# Patient Record
Sex: Female | Born: 1945 | Race: White | Hispanic: No | Marital: Married | Smoking: Never smoker
Health system: Southern US, Academic
[De-identification: ages and names within clinical notes are randomized; demographics above are authoritative.]

## PROBLEM LIST (undated history)

## (undated) DIAGNOSIS — E039 Hypothyroidism, unspecified: Secondary | ICD-10-CM

## (undated) DIAGNOSIS — G4730 Sleep apnea, unspecified: Secondary | ICD-10-CM

## (undated) DIAGNOSIS — I10 Essential (primary) hypertension: Secondary | ICD-10-CM

## (undated) HISTORY — PX: HX KNEE REPLACMENT: SHX125

## (undated) HISTORY — PX: HX BACK SURGERY: SHX140

## (undated) HISTORY — PX: LUMBAR DISC SURGERY: SHX700

## (undated) HISTORY — PX: HX HERNIA REPAIR: SHX51

## (undated) HISTORY — PX: HX APPENDECTOMY: SHX54

## (undated) NOTE — Telephone Encounter (Signed)
Formatting of this note might be different from the original.  Pt is calling back to confirm that you got her messages.  She said the local facilities you can look at are:    [facility]    [facility]    [facility]    [facility]    Please call her back to confirm where   Electronically signed by Odella Aquas at 01/23/2023 10:46 AM EDT

## (undated) NOTE — Telephone Encounter (Signed)
Formatting of this note might be different from the original.  Called patient to go over the patients Film Review per Dr. Debarah Crape.  Patient hasn't gotten an EMG as of yet because she needed this to be scheduled closer to home. She advised she gave information to [facility] to have it scheduled near her and Tresa Endo was going to take care of it for her. Advised the patient that she needs to see pain management per Dr. Debarah Crape and she advised that she has an appointment in September to see them. Patient request that the results of Dr. De Hollingshead film review sent to [facility]. Received the patients email address to set up mychart. Advised the patient to look out for the email. Once my chart is set up I will be able to send Dr. De Hollingshead review to her. Patient agreed to Dr. De Hollingshead recommendations.  Electronically signed by Lyman Bishop, RN at 03/24/2023 10:03 AM EDT

---

## 1993-01-24 ENCOUNTER — Other Ambulatory Visit (HOSPITAL_COMMUNITY): Payer: Self-pay

## 2010-10-11 IMAGING — MG MAMMO SCREEN W CAD
1 series · 5 of 5 positions shown · non-contrast
Comparison: 03/19/07 and 01/16/06.

Guraziu, Orgesi Z
INDICATION: Screening
ADDITIONAL HISTORY:
Patient denies previous breast surgery. She denies any current problems with her breasts. 

RISK FACTOR:
Positive family history of breast cancer (mother).
TECHNIQUE: Bilateral MLO and CC digital images were obtained. This study was reviewed using iCAD 200 software.

[Series 2: R CC · right · 5 of 5 slices shown]
[im 1/5]
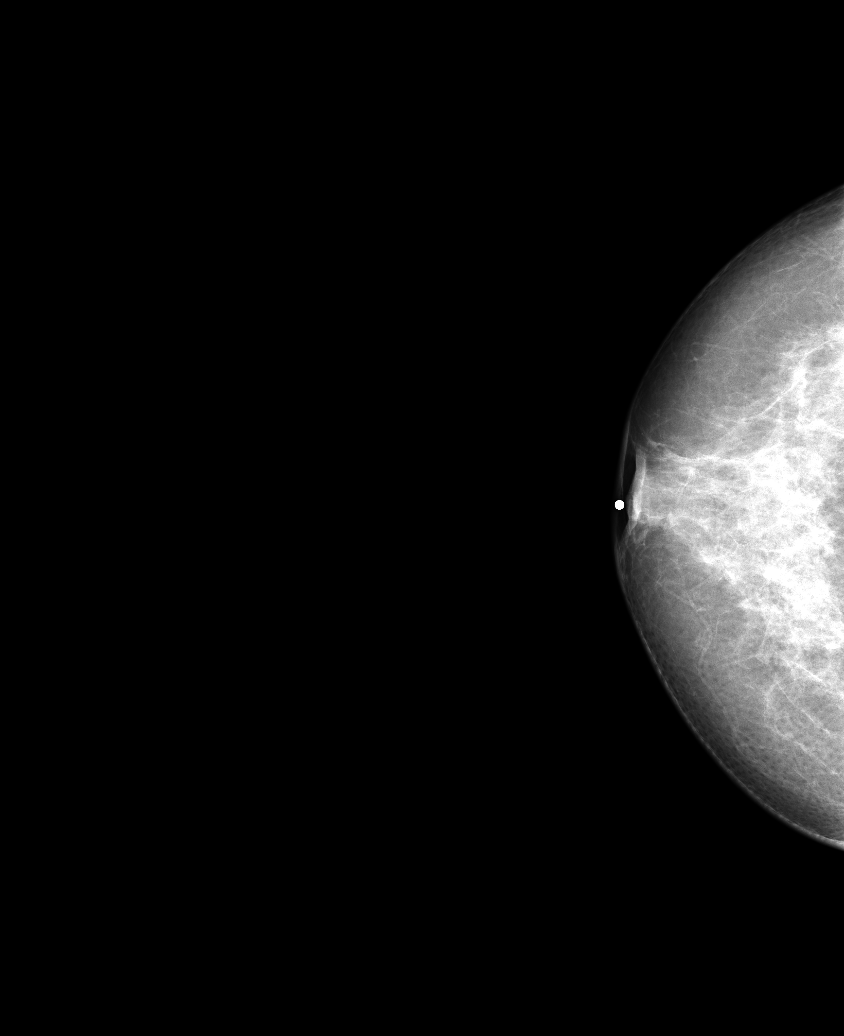
[im 2/5]
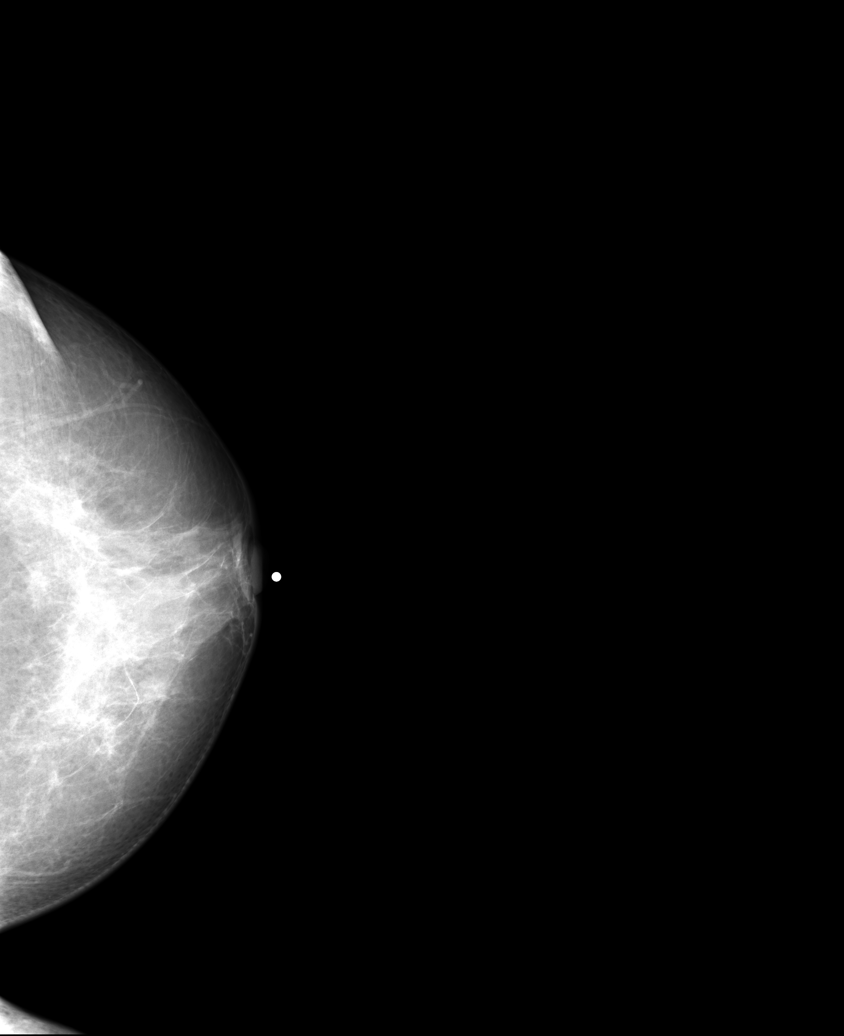
[im 3/5]
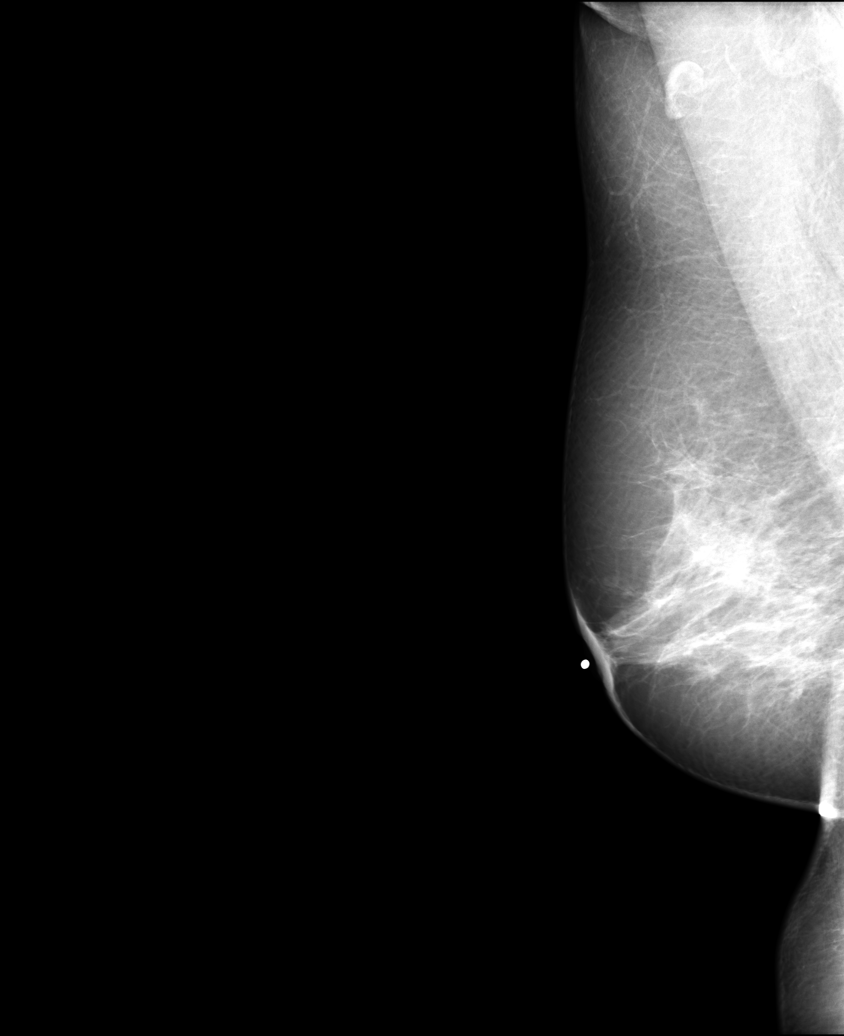
[im 4/5]
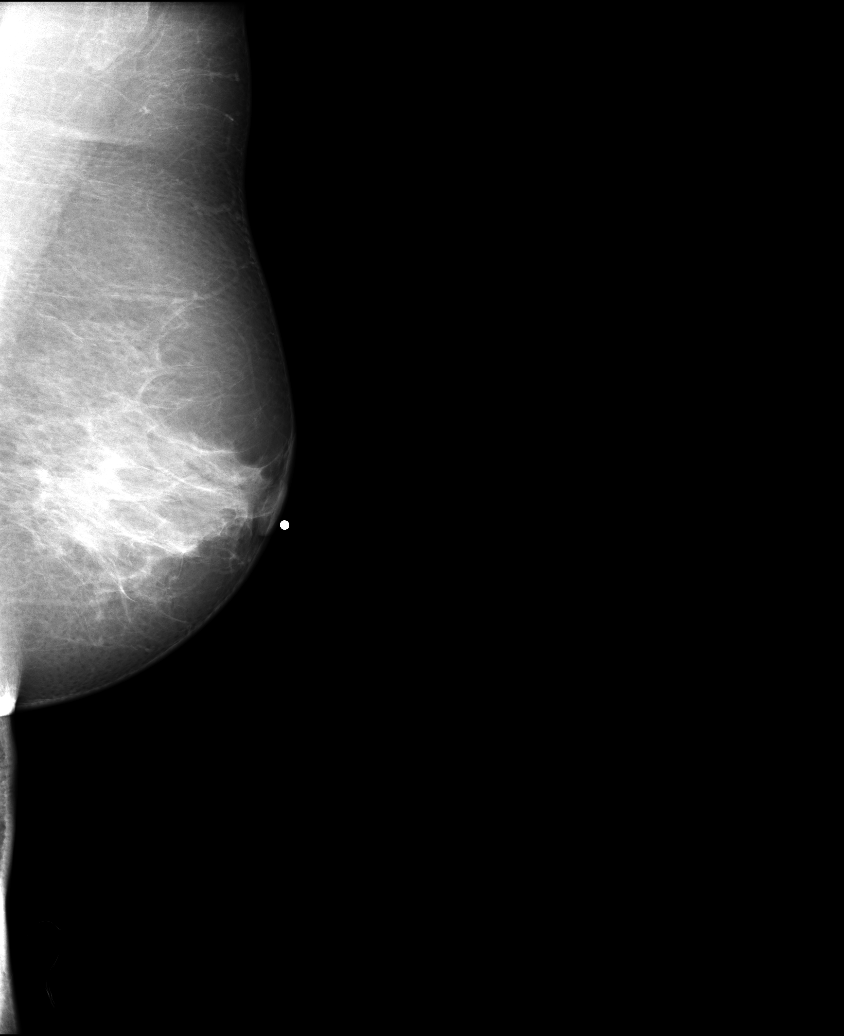
[im 5/5]
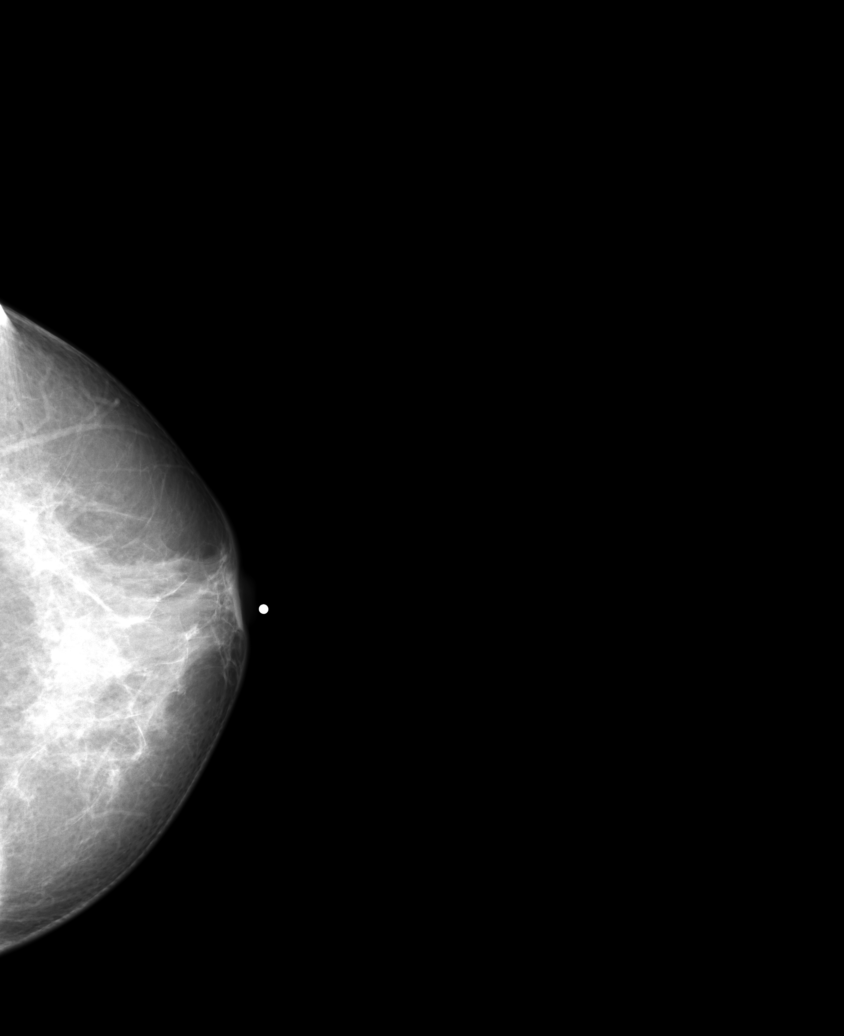

[5 of 5 positions shown; findings below may reference images not displayed]

FINDINGS: The breasts have scattered fibroglandular density. There is no skin thickening, or nipple retraction noted. No suspicious masses, microcalcifications or architectural distortion are identified.
IMPRESSION: BI-RADS 1

RECOMMENDATION: Annual screening per ACR guidelines and monthly self breast examination. 

FINAL ASSESSMENT CODE:
BI-RADS 1

________________________________

## 2013-07-03 IMAGING — US US ABDOMEN LIMITED
1 series · 14 of 25 positions shown · non-contrast
Comparison: Abdominal sonogram dated 02/07/2013.

Exam:   
Ultrasound abdomen limited
INDICATION: Abdominal pain.

[Series 1: us abdomen limited · oblique · 0.31mm/px · 14 of 47 slices shown]
[im 1/47]
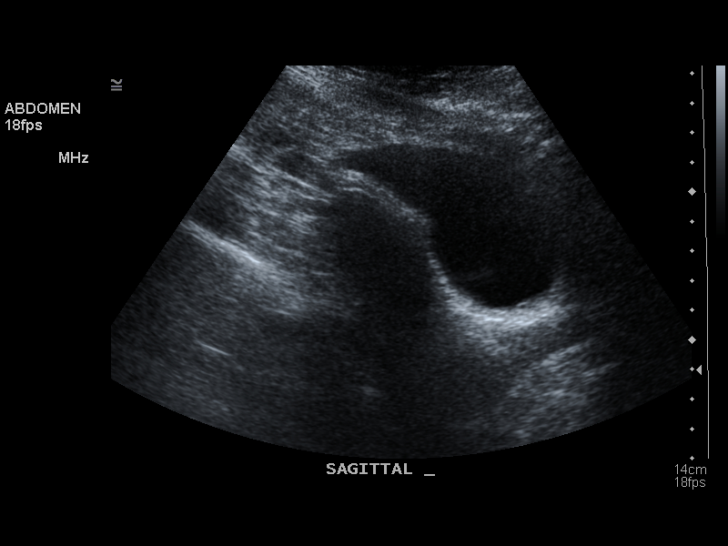
[im 4/47]
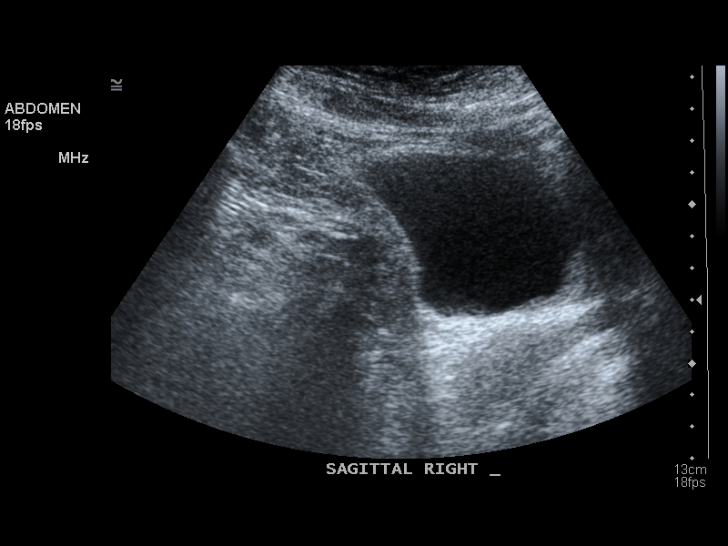
[im 8/47]
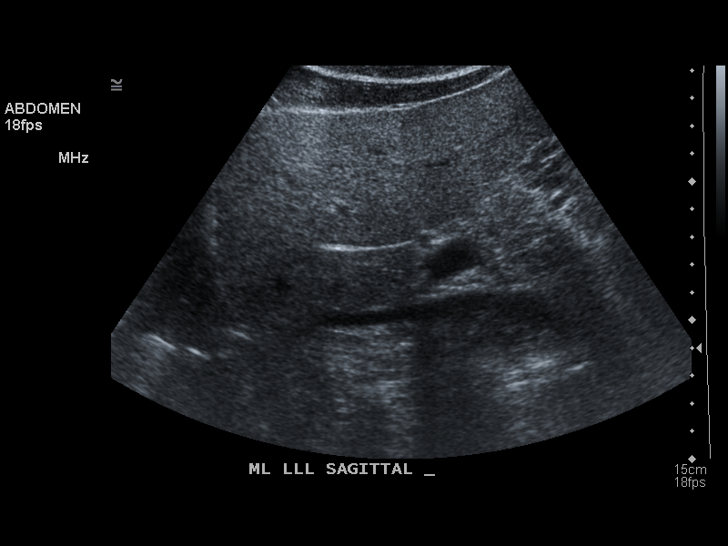
[im 12/47]
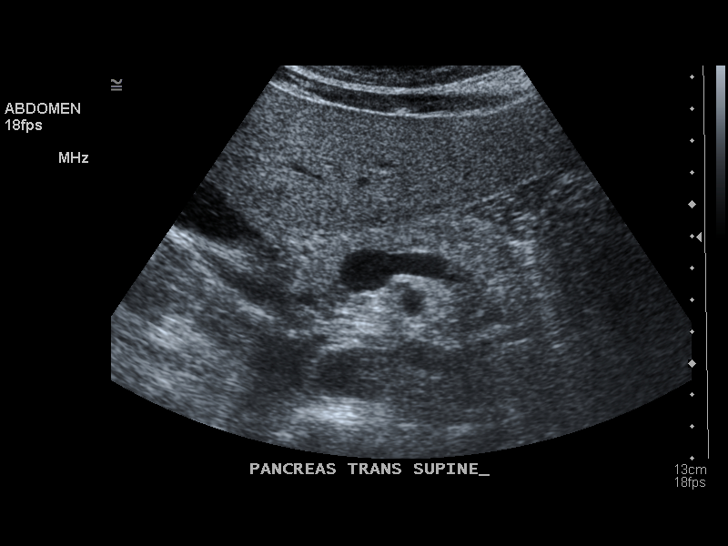
[im 16/47]
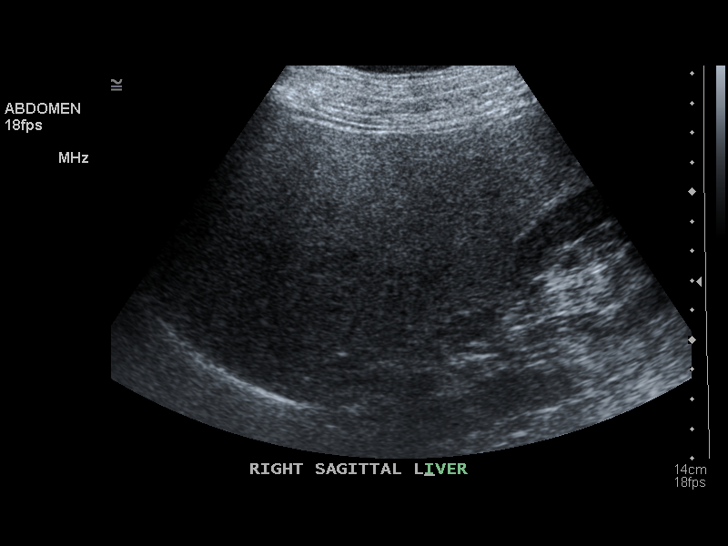
[im 18/47]
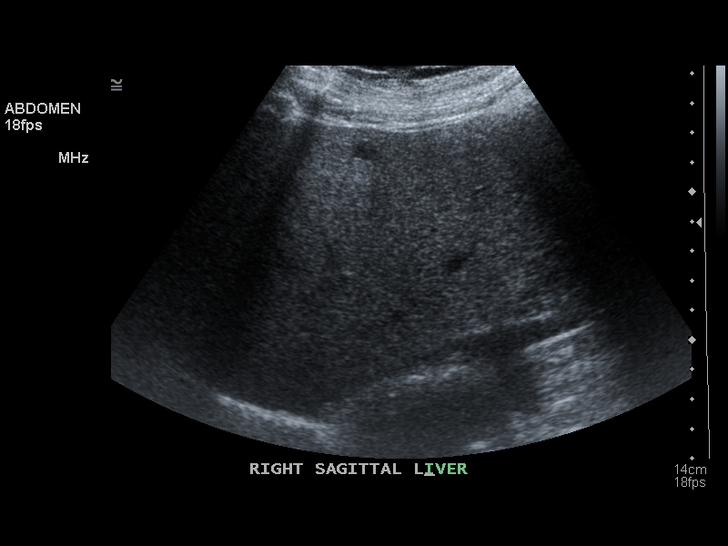
[im 22/47]
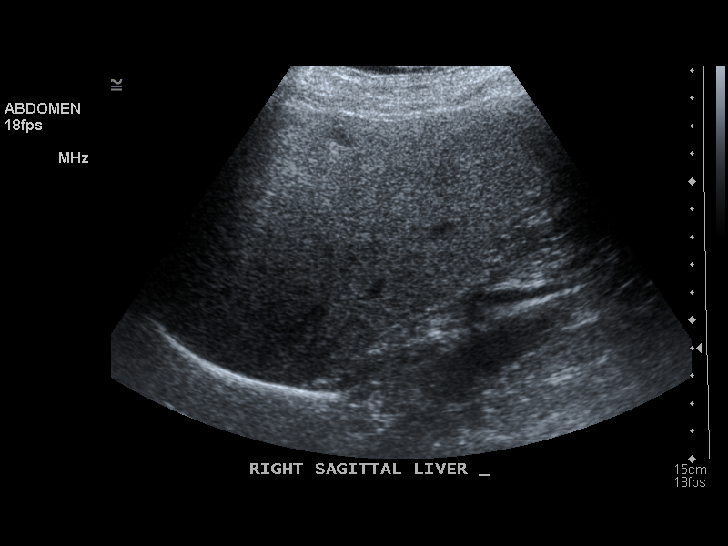
[im 25/47]
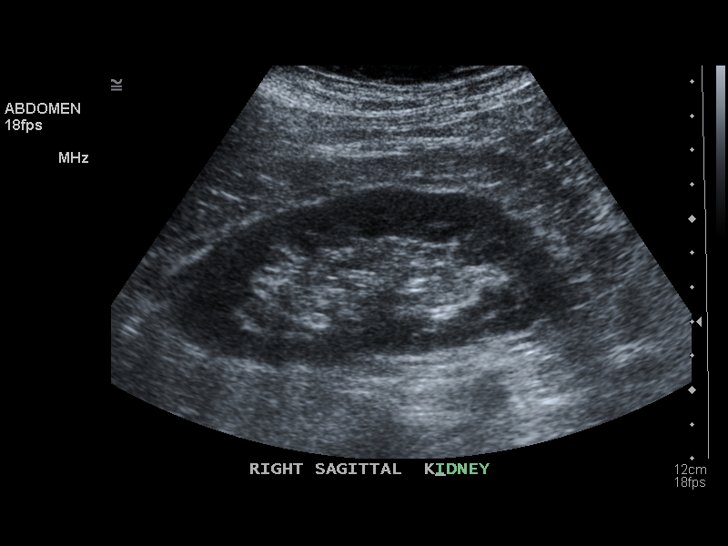
[im 29/47]
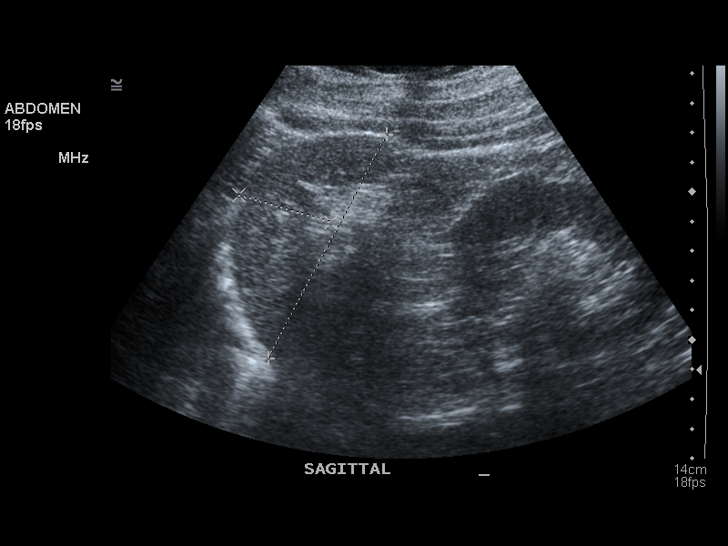
[im 31/47]
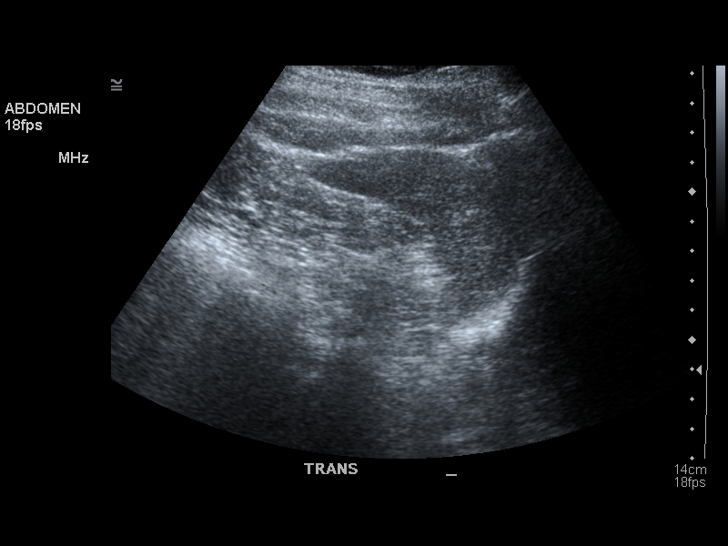
[im 35/47]
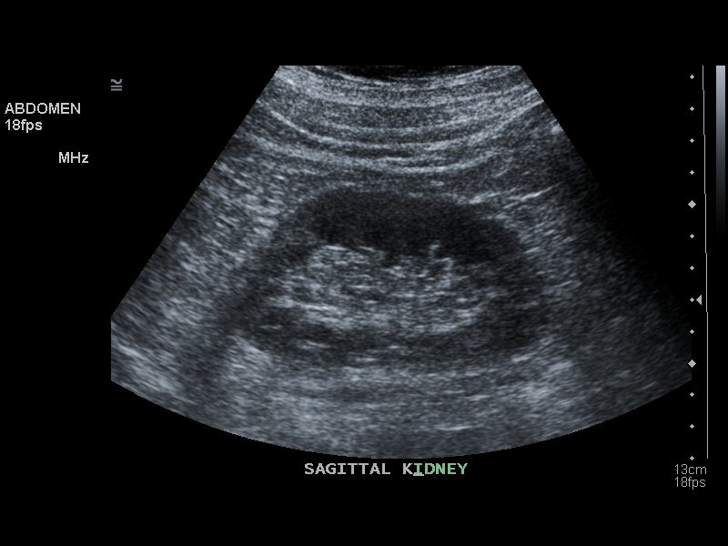
[im 39/47]
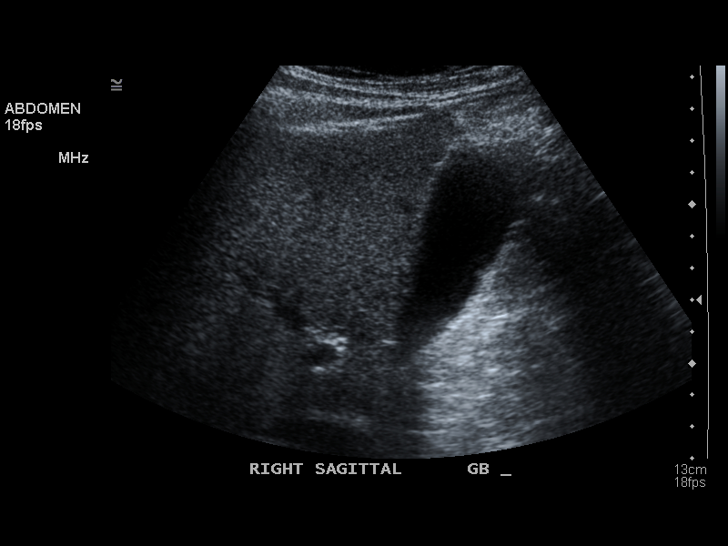
[im 43/47]
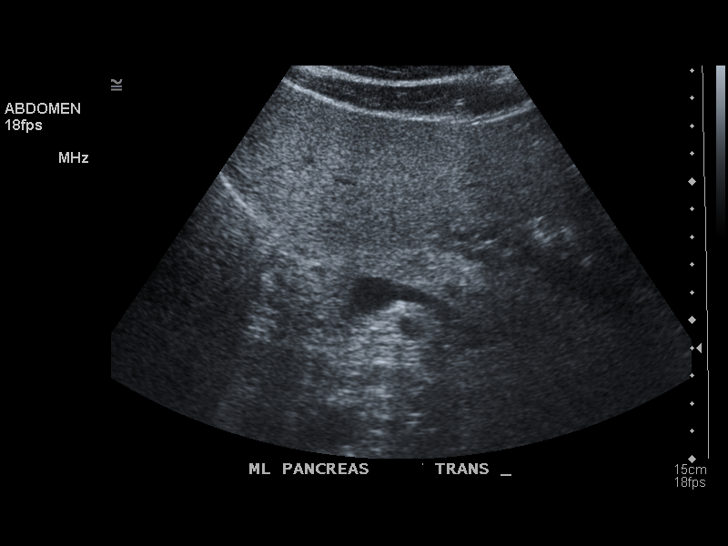
[im 47/47]
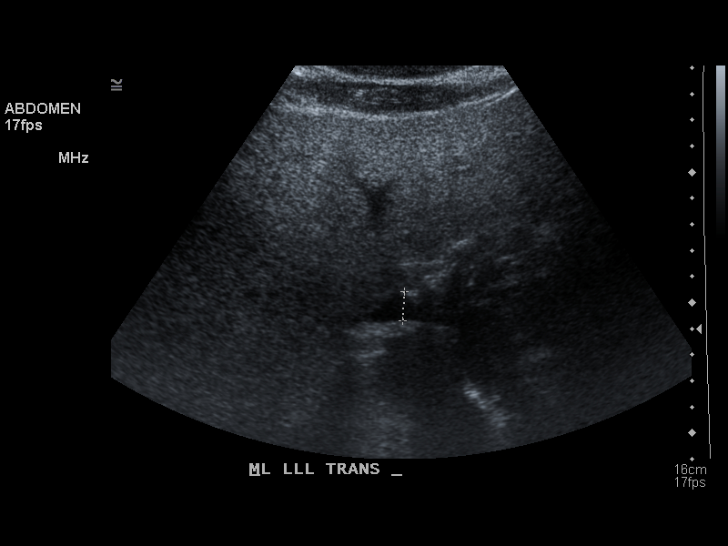

[14 of 25 positions shown; findings below may reference images not displayed]

FINDINGS: Liver is echogenic compatible with fatty infiltration. This limits evaluation for a focal hepatic mass. 8 mm right hepatic lobe cyst is stable. There is no intra or extra hepatic biliary ductal dilatation. Common bile duct measures 5 mm. No sludge or shadowing calculi are seen within the gallbladder. There is no gallbladder wall thickening or pericholecystic fluid. Pancreas is normal. Spleen measures 8.5 cm and is unremarkable. 
Kidneys are normal in echogenicity. Right kidney measures 11 cm and left kidney measures 10 cm. There is no hydronephrosis, mass or shadowing calculus on either side. 
Visualized abdominal aorta is without aneurysmal dilatation. IVC is normal. There is no ascites.
IMPRESSION: 1.
 Fatty liver. 
2.
Stable 8 mm right hepatic lobe cyst. 
3.
No evidence of cholelithiasis or acute cholecystitis.

## 2013-08-01 IMAGING — MG MAMMO DIGITAL SCREENING
1 series · 4 of 4 positions shown · non-contrast
Comparison: 

------------- REPORT [id number] -------------
HOLLY, HON KEUNG

MAMMO DIGITAL SCREENING WITH CAD
Exam:  
Bilateral digital screening mammogram with CAD
INDICATION: Annual screening.

[R CC · oblique · right · 4 of 4 slices shown]
[im 1/4]
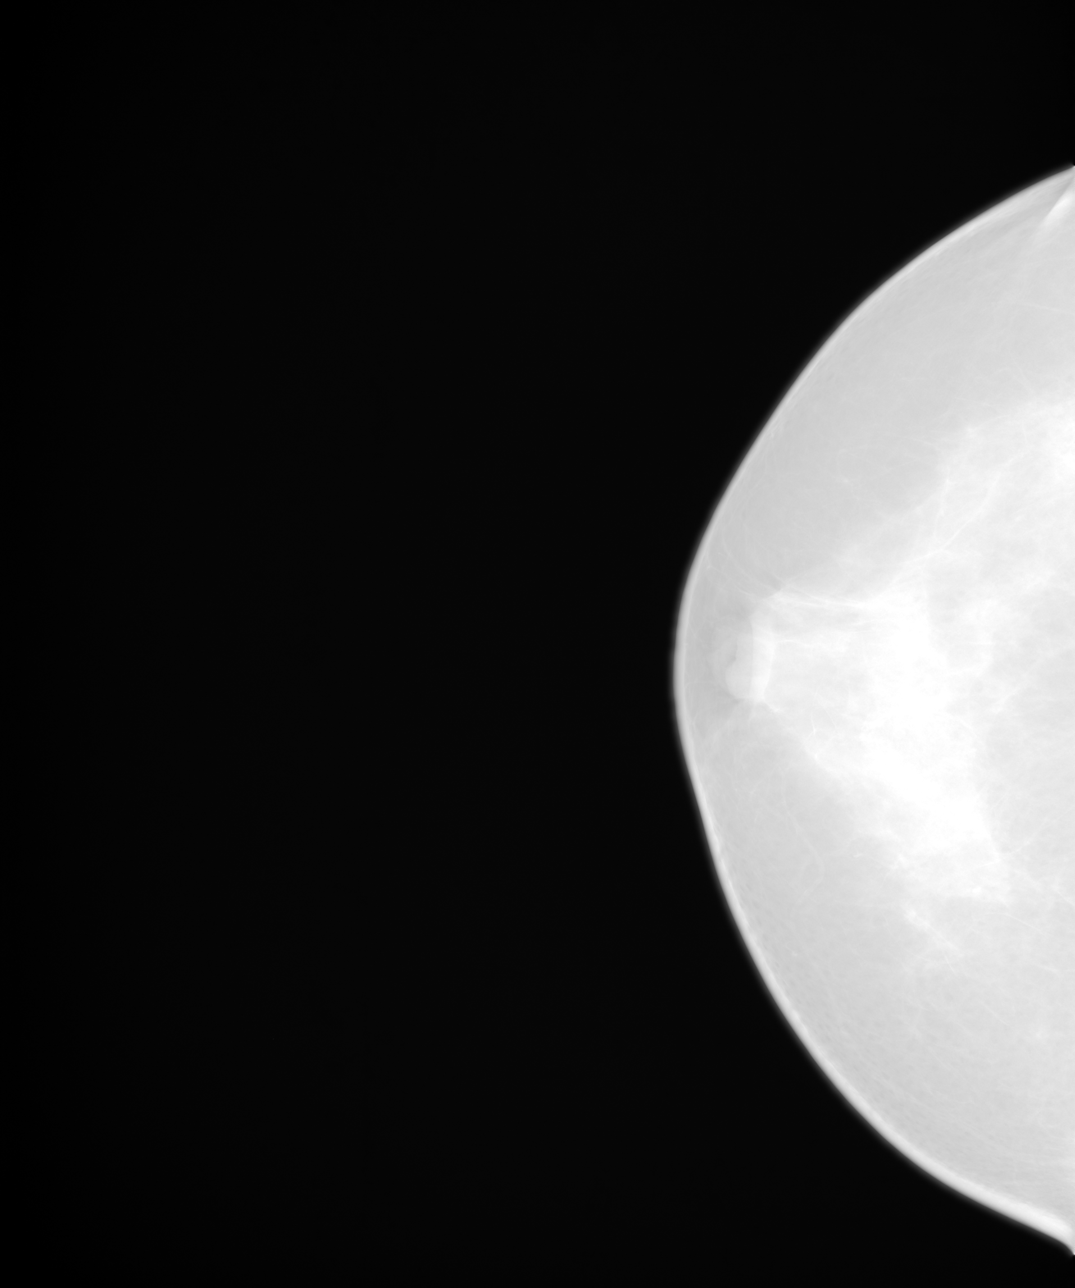
[im 2/4]
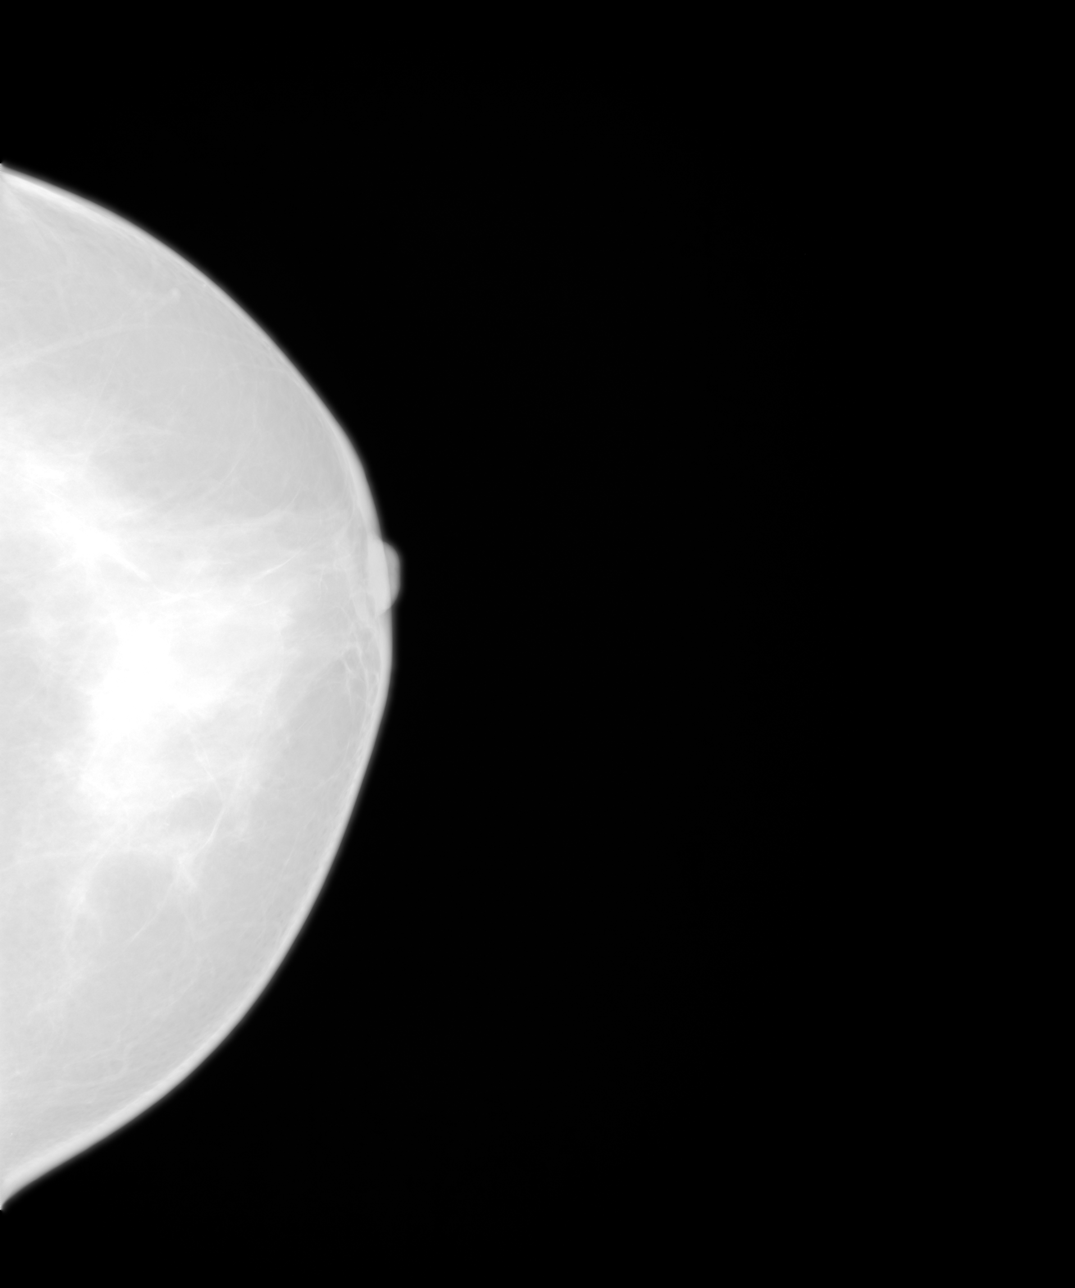
[im 3/4]
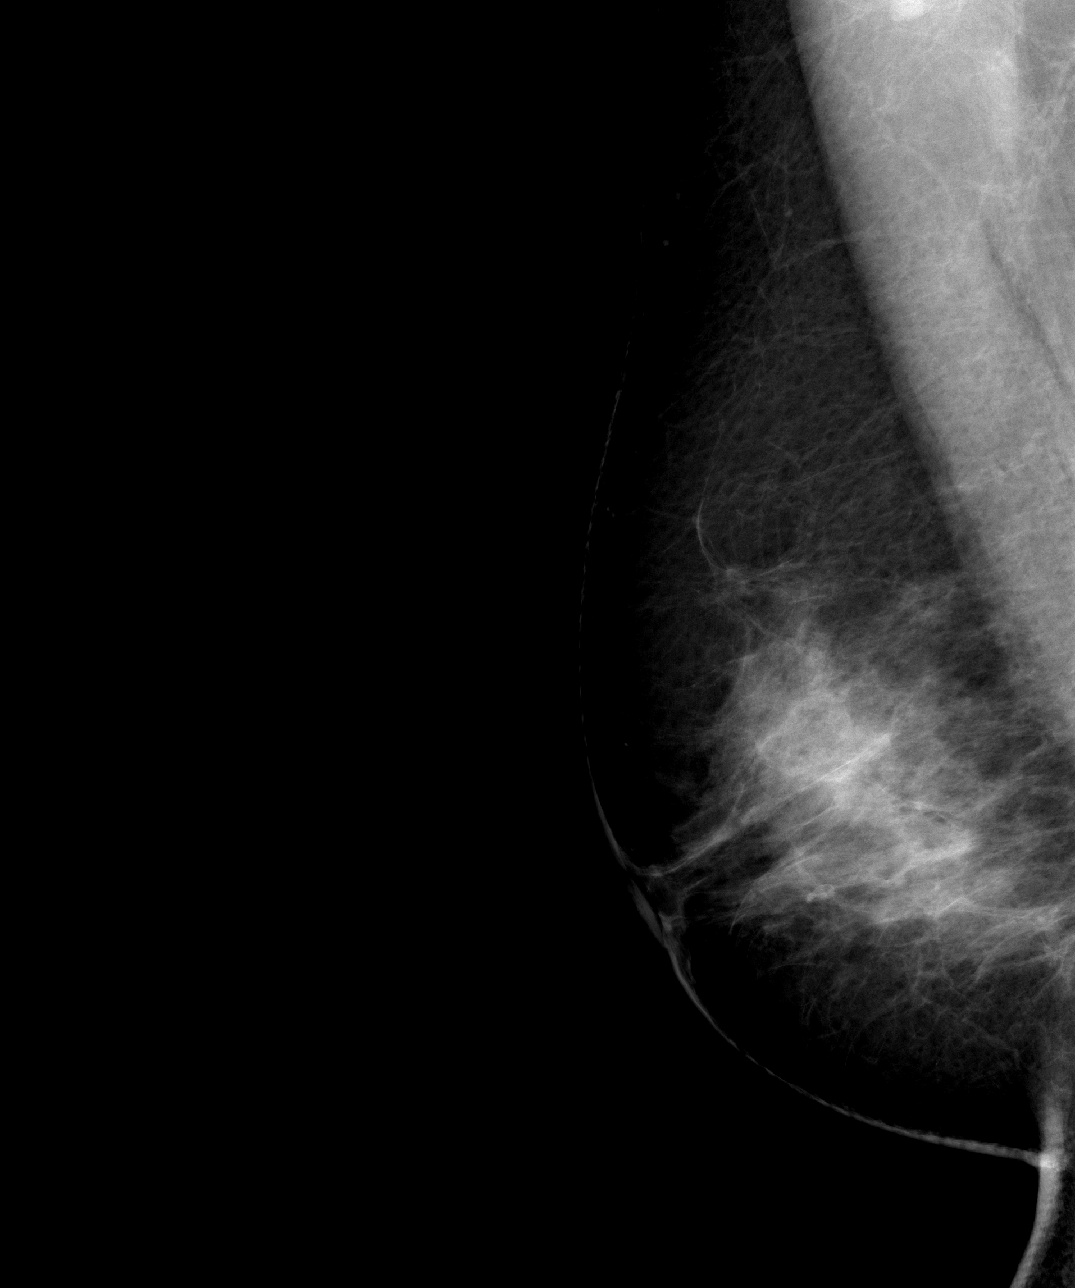
[im 4/4]
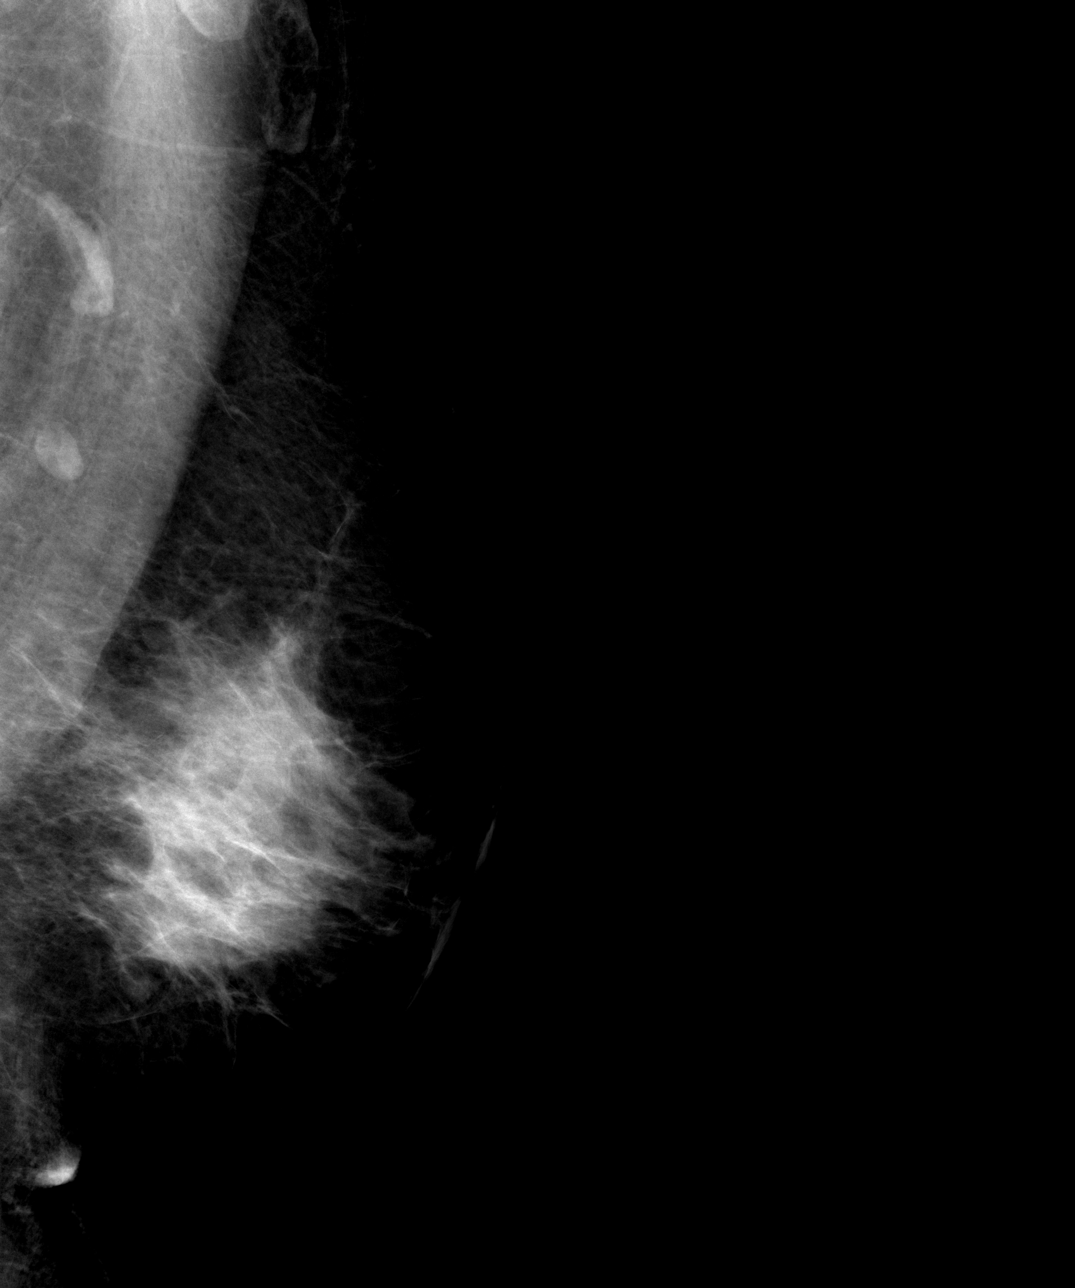

[4 of 4 positions shown; findings below may reference images not displayed]

FINDINGS: Breast parenchyma is heterogeneously dense; this decreases the sensitivity of mammogram. There is no mass or suspicious cluster of microcalcifications. There is no architectural distortion, skin thickening or nipple retraction.
IMPRESSION: 1. Bi-Rads 2-Benign findings. 
RECOMMENDATIONS: Annual screening mammogram as per ACR guidelines is recommended. 
Final Assessment Code:
Bi-Rads 2 

BI-RADS 0
Need additional imaging evaluation
BI-RADS 1
Negative mammogram
BI-RADS 2
Benign finding
BI-RADS 3
Probably benign finding: short-interval follow-up suggested
BI-RADS 4
Suspicious abnormality:  biopsy should be considered
BI-RADS 5
Highly suggestive of malignancy; appropriate action should be taken
BI-RADS 6
Known Biopsy-proven Malignancy  Appropriate action should be taken
NOTE:
In compliance with Federal regulations, the results of this mammogram are being sent to the patient.

------------- REPORT [id number] -------------
[facility]
[address]
We wish to report the following on your recent mammography examination. We are sending a report to your referring physician or other health care provider. 
(       Normal/Negative:
No evidence of cancer.
This statement is mandated by the [place], Department of Health.
Your examination was performed by one of our technologists, who are registered radiological technologists and also specially certified in mammography:
___
Preciado, Brittnay (M)
___
Cipolla, Mark (M)
___
Gule, Tasisios (M)

Your mammogram was interpreted by our radiologist.

( 
Maija-Riitta Chuhan, M.D.

(Annual Breast Examination by a physician or other health care provider
(Annual Mammography Screening beginning at age 40
(Monthly Breast Self Examination

## 2013-08-01 IMAGING — CT CT ABDOMEN W/O & W/CONTRAST
2 of 4 series · 14 of 32 positions shown, 19 images · IV contrast (agent unspecified)
Comparison: Ultrasound abdomen dated 07/03/2013.

Exam:   
CT abdomen without and with contrast
INDICATION: Abdominal pain and fatty liver.
TECHNIQUE: Axial CT imaging of the abdomen and pelvis was performed without and with 100 cc of Optiray 300. Oral contrast was also administered. Additional reformatted sagittal and coronal projections were also obtained. Total examination DLP 7554 mGy cm.

[Series 904: abd venous (safect) · axial · portal-venous · 0.86mm/px · z∈[-1330,-982]mm · 8 of 150 slices shown, 13 images]
[im 17/150  soft-tissue]
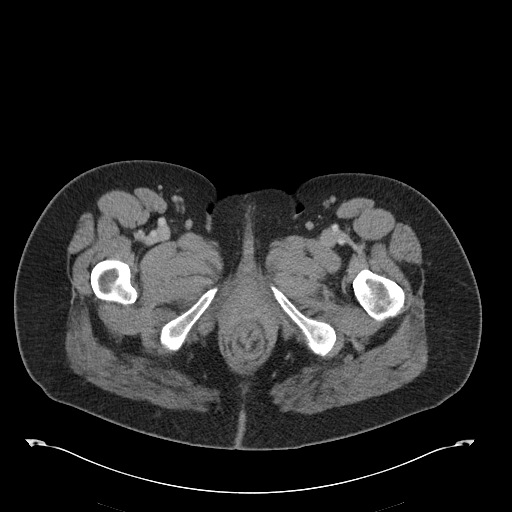
[im 17/150  bone]
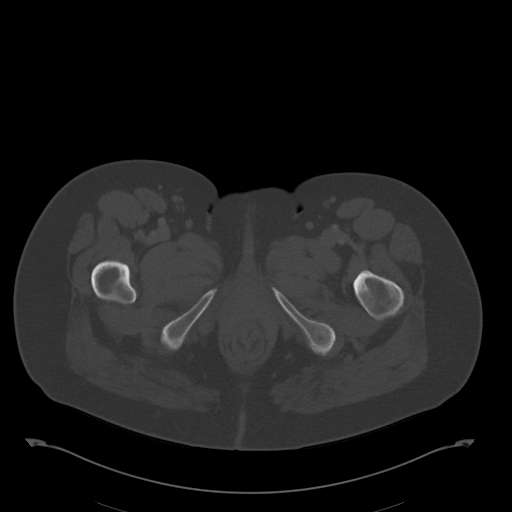
[im 34/150  soft-tissue]
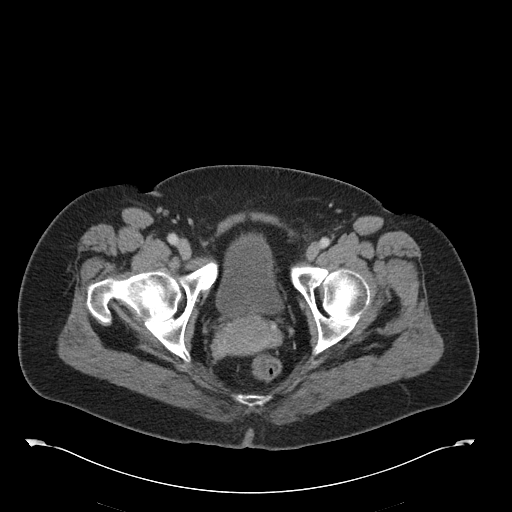
[im 50/150  soft-tissue]
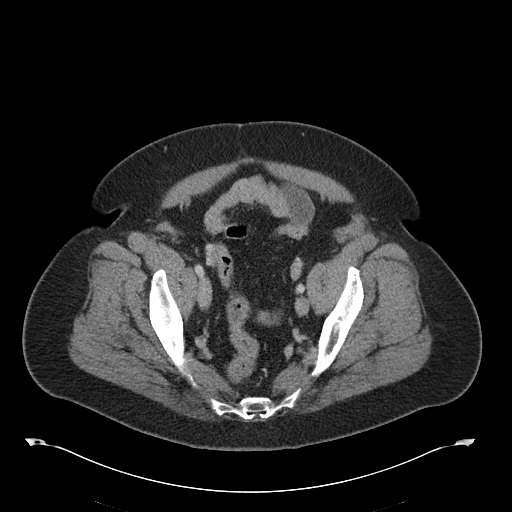
[im 67/150  soft-tissue]
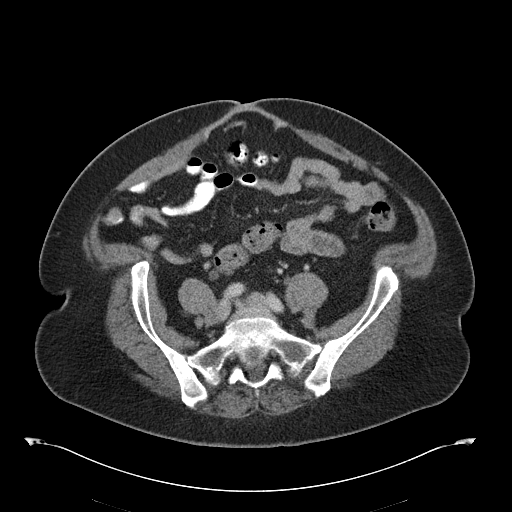
[im 83/150  soft-tissue]
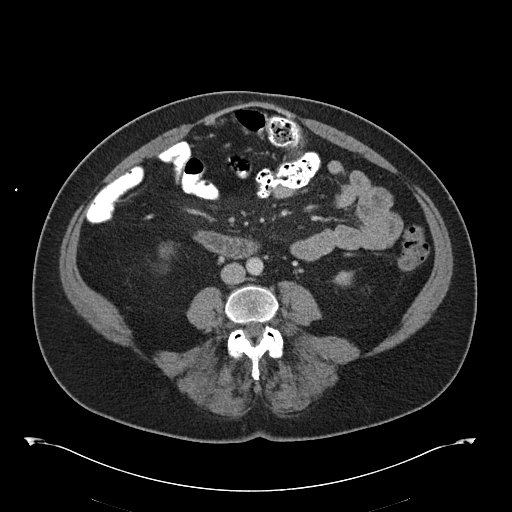
[im 83/150  lung]
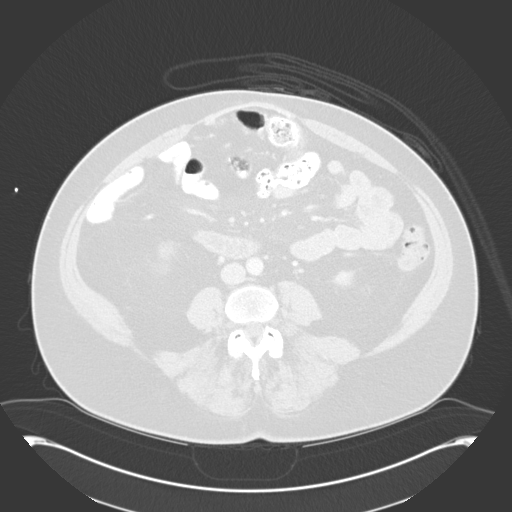
[im 100/150  soft-tissue]
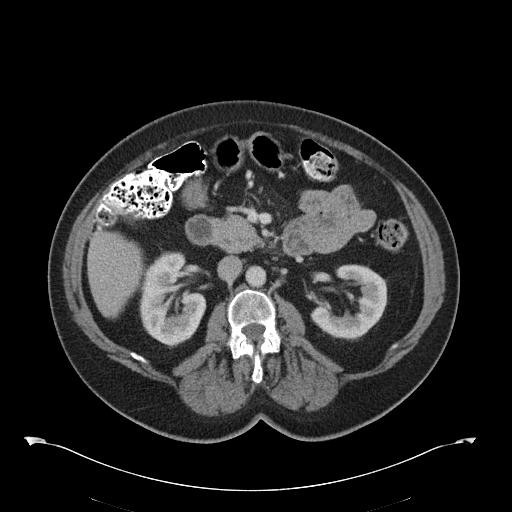
[im 100/150  lung]
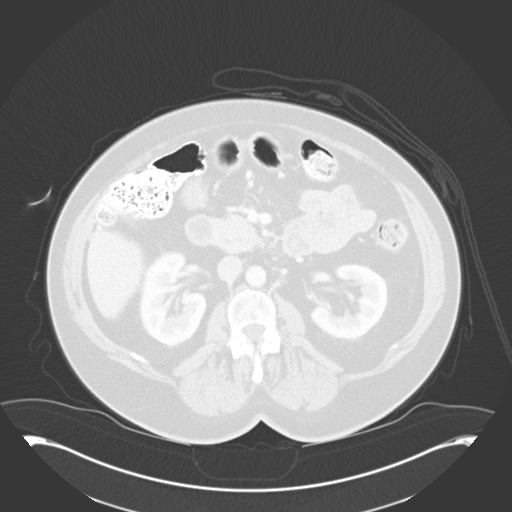
[im 116/150  soft-tissue]
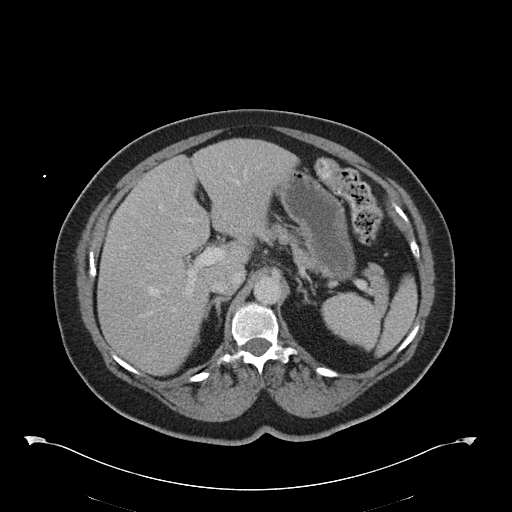
[im 116/150  lung]
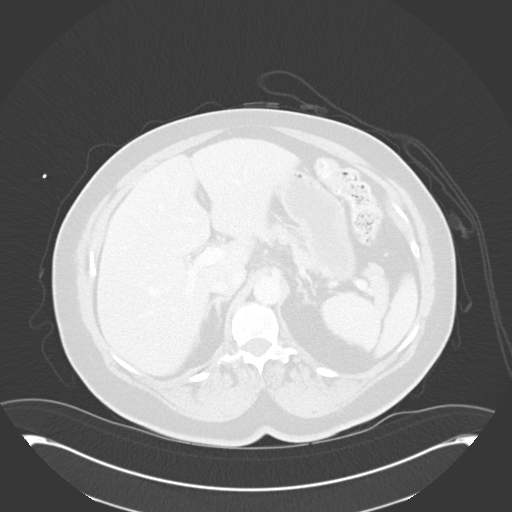
[im 133/150  soft-tissue]
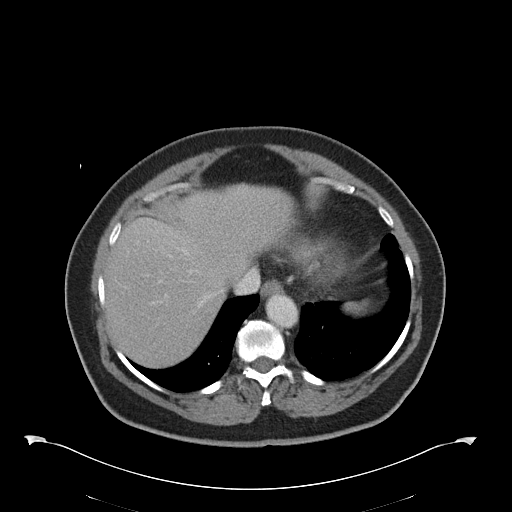
[im 133/150  lung]
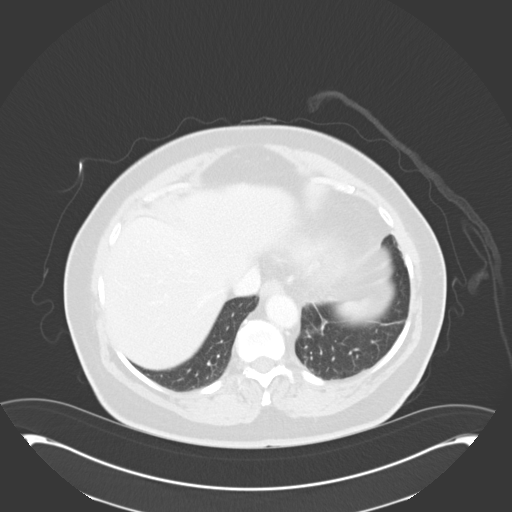

[Series 907: abd soft delay (safect) · axial · delayed · 0.86mm/px · z∈[-1328,-1079]mm · 6 of 150 slices shown]
[im 17/150  soft-tissue]
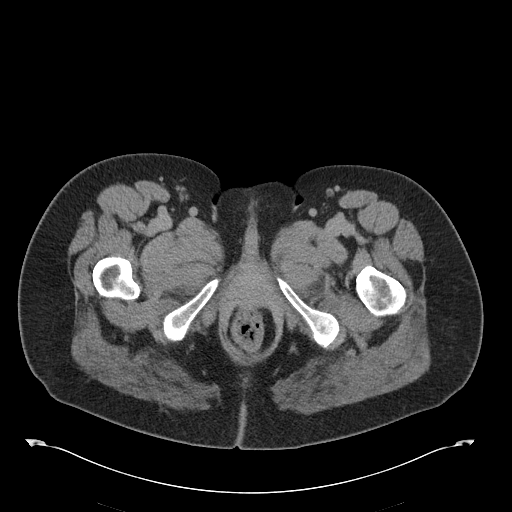
[im 34/150  soft-tissue]
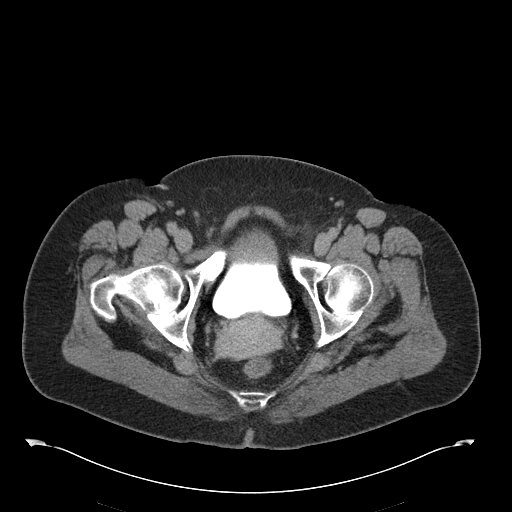
[im 50/150  soft-tissue]
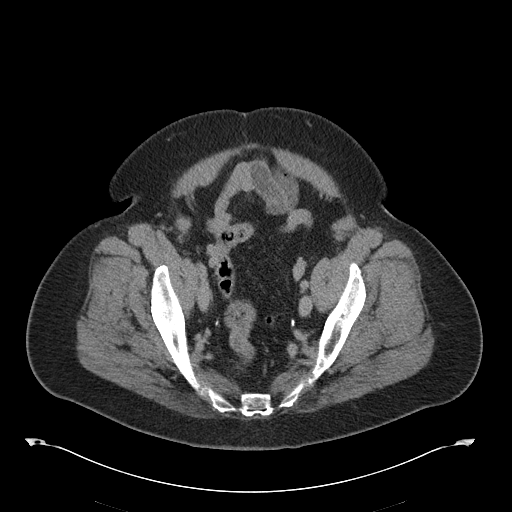
[im 67/150  soft-tissue]
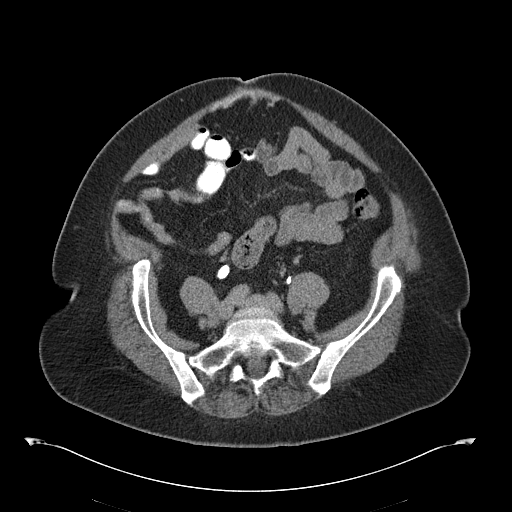
[im 83/150  soft-tissue]
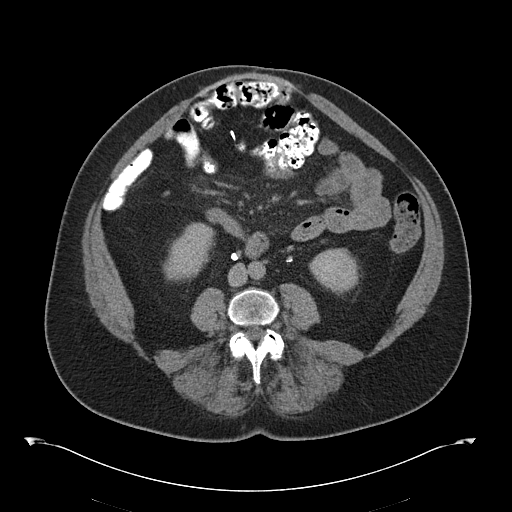
[im 100/150  soft-tissue]
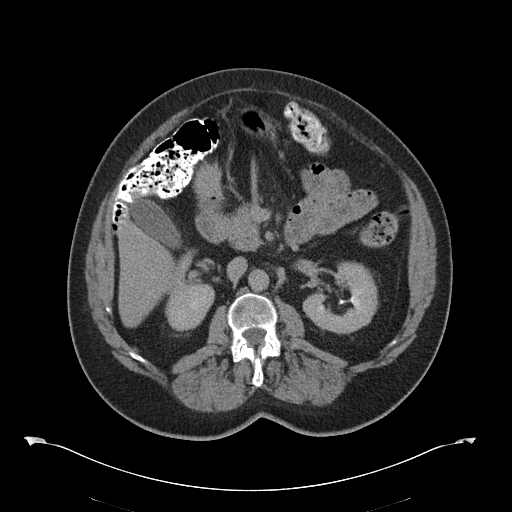

[14 of 32 positions shown; findings below may reference images not displayed]

FINDINGS: ABDOMEN:
Limited visualization of lung bases is unremarkable. There is no pleural or pericardial effusion. 
There is fatty infiltration of the liver. 8 mm right hepatic lobe cyst is again identified. Gallbladder, spleen, pancreas, adrenal glands and kidneys are normal. Bowel loops are normal in course and caliber; there is no obstruction or free air. There is no adenopathy. Post surgical changes of a ventral abdominal hernia mesh repair are also noted. 
PELVIS:
There is no acute inflammatory process. The appendix is surgically absent. There is no pelvic free fluid. There is no adenopathy. Mild to moderate disc space narrowing is noted at L5-S1 level.
IMPRESSION: 1.
No acute abnormality within the upper abdomen and pelvis. 
2.
Fatty liver with a stable 8 mm right hepatic lobe cyst. 
3.
Prior appendectomy.

## 2014-08-04 IMAGING — MG MAMMO DIGITAL SCREENING
1 series · 6 of 6 positions shown · non-contrast
Comparison: 

------------- REPORT [id number] -------------
RODRIGUES, ASWIN

MAMMO DIGITAL SCREENING WITH CAD
Exam:  
Screening digital mammogram with CAD
INDICATION: Annual.

[R CC · oblique · right · 6 of 6 slices shown]
[im 1/6]
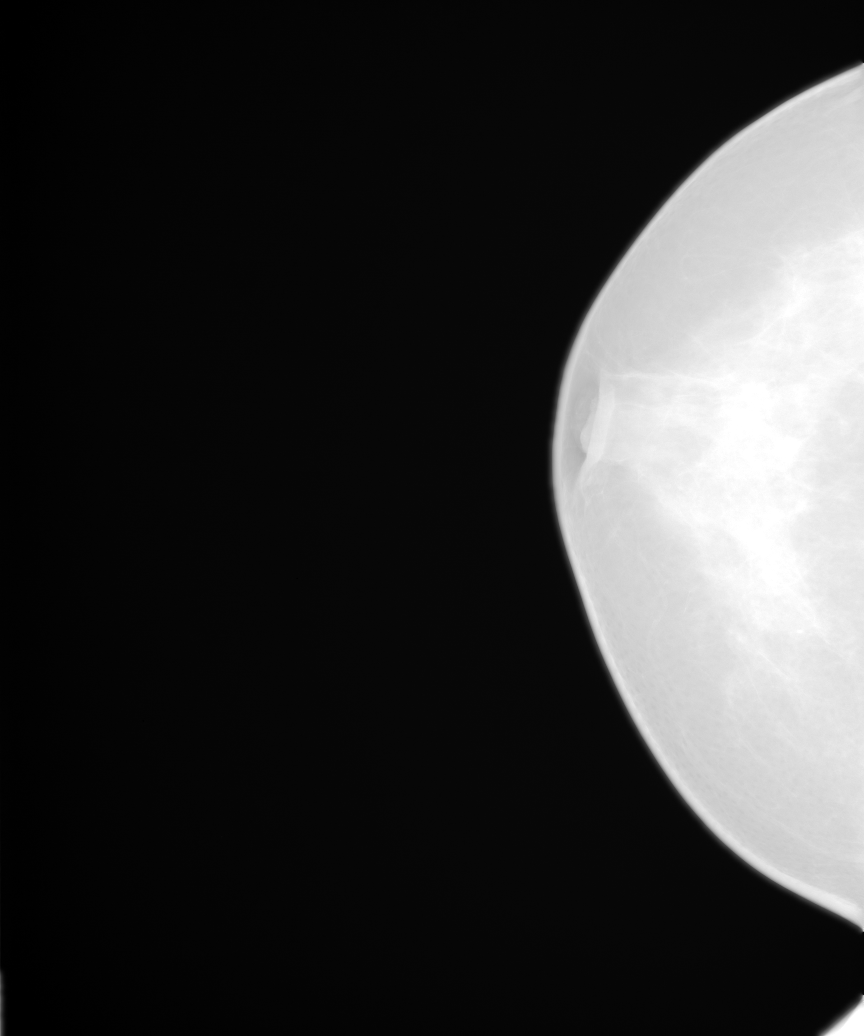
[im 2/6]
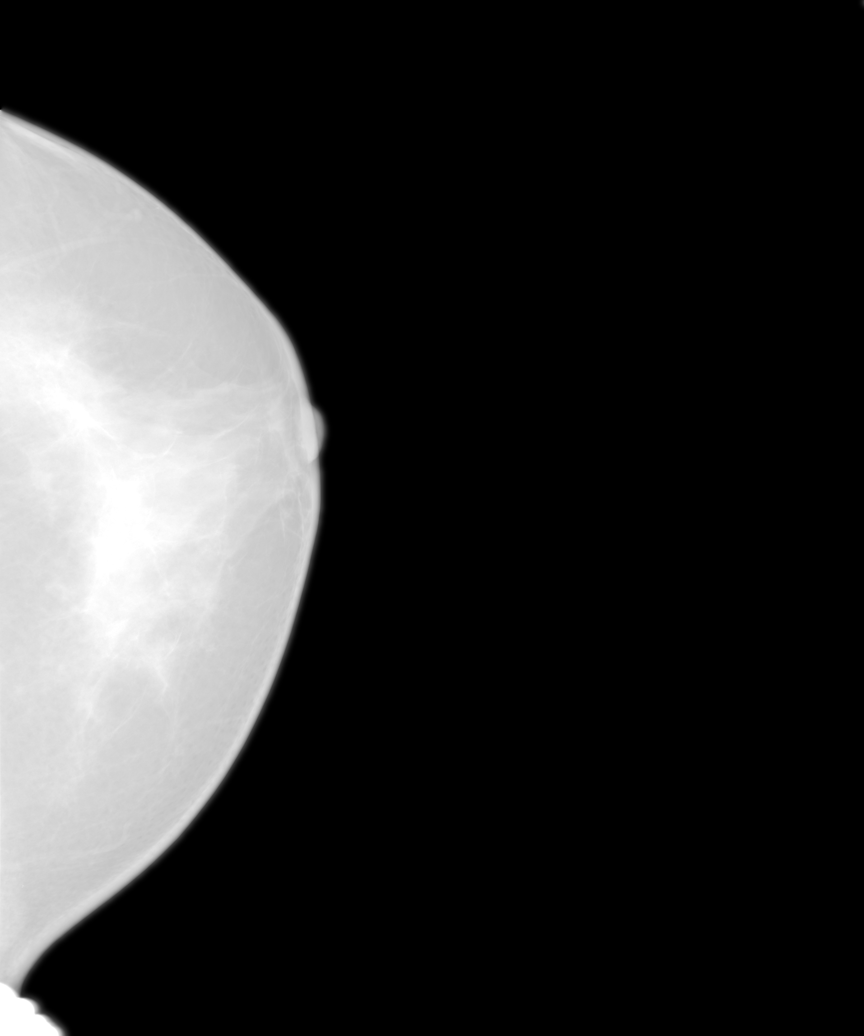
[im 3/6]
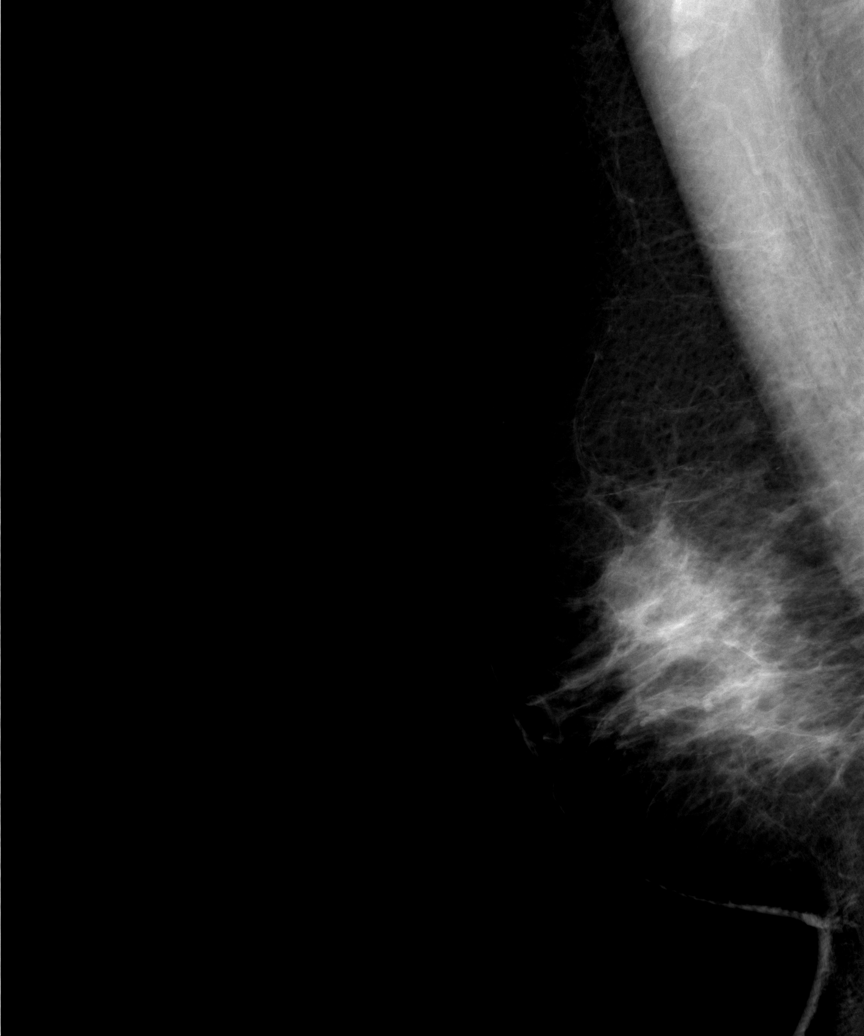
[im 4/6]
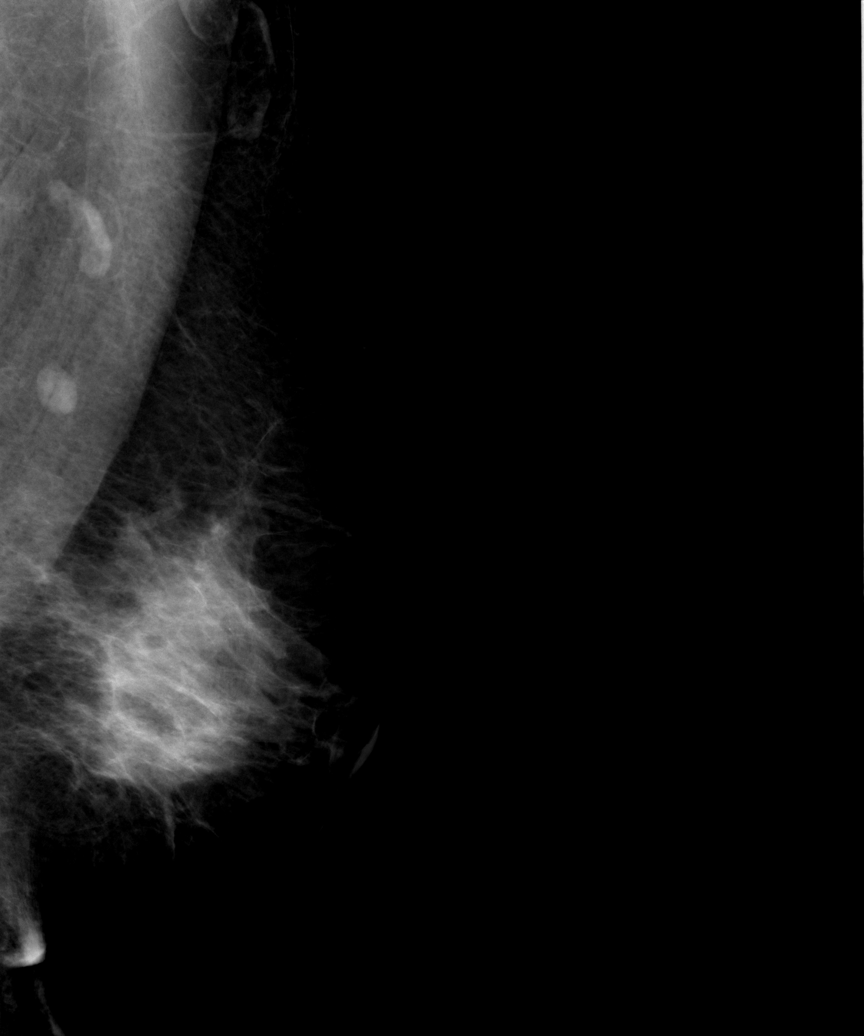
[im 5/6]
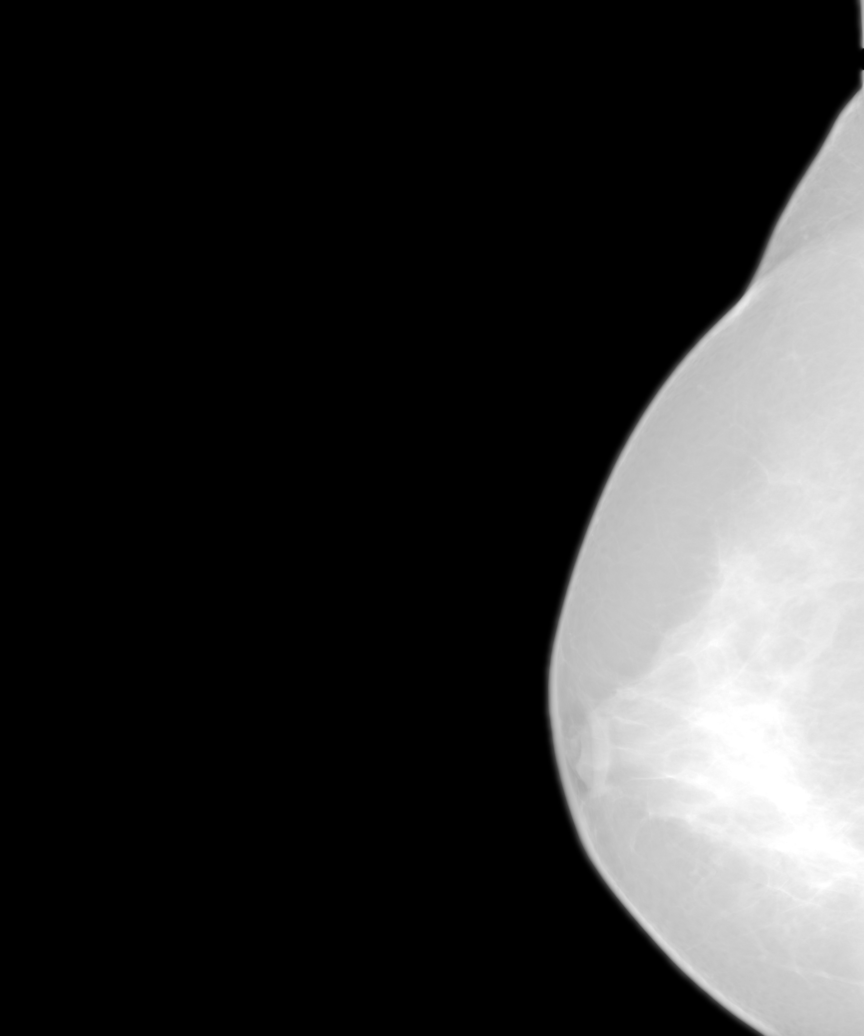
[im 6/6]
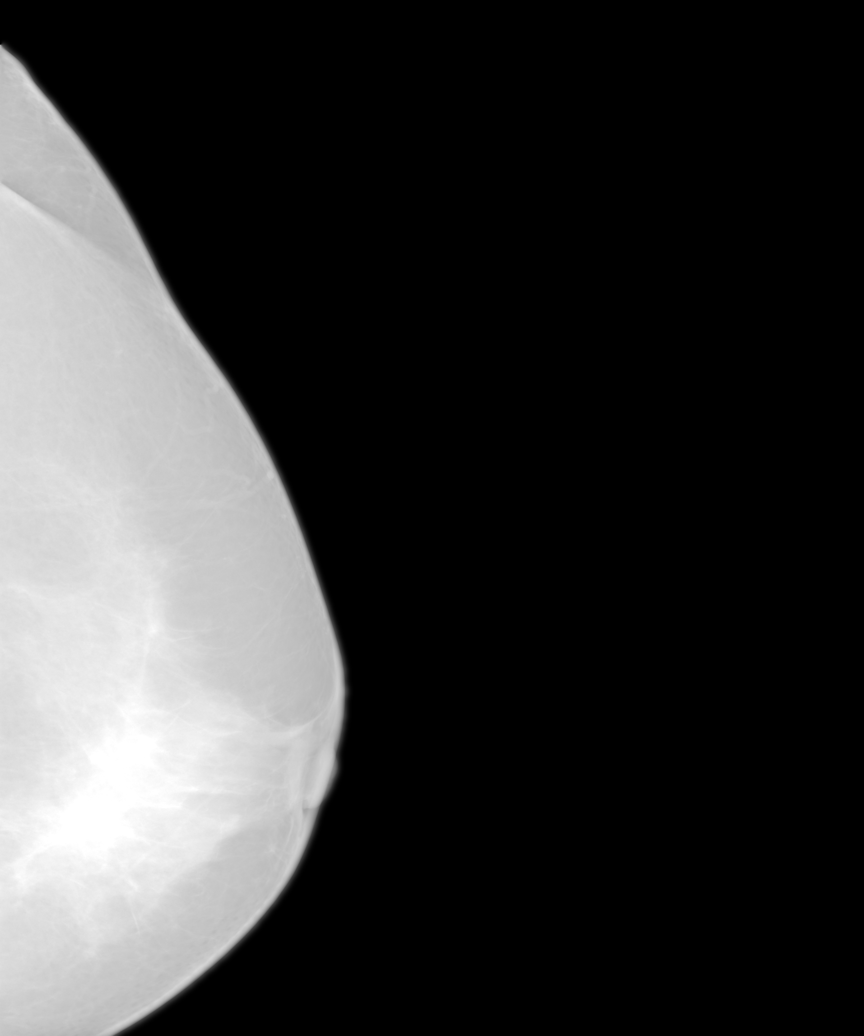

[6 of 6 positions shown; findings below may reference images not displayed]

FINDINGS: Bilateral breasts are heterogeneously dense. No new mass or asymmetry noted. No new skin thickening, nipple retraction or calcifications seen.
IMPRESSION: 1.
BIRADS 2-Benign findings. Please note this patient has dense breasts which limits mammographic sensitivity. 
Final Assessment Code:
Bi-Rads 2 

BI-RADS 0
Need additional imaging evaluation
BI-RADS 1
Negative mammogram
BI-RADS 2
Benign finding
BI-RADS 3
Probably benign finding: short-interval follow-up suggested
BI-RADS 4
Suspicious abnormality:  biopsy should be considered
BI-RADS 5
Highly suggestive of malignancy; appropriate action should be taken
BI-RADS 6
Known Biopsy-proven Malignancy  Appropriate action should be taken
NOTE:
In compliance with Federal regulations, the results of this mammogram are being sent to the patient.

------------- REPORT [id number] -------------
[facility]
[address]
We wish to report the following on your recent mammography examination. We are sending a report to your referring physician or other health care provider. 
(       Normal/Negative:
No evidence of cancer.
This statement is mandated by the [place], Department of Health.
Your examination was performed by one of our technologists, who are registered radiological technologists and also specially certified in mammography:
___
Preciado, Brittnay (M)
___
Cipolla, Mark (M)
___
Gule, Tasisios (M)

Your mammogram was interpreted by our radiologist.

( 
Maija-Riitta Chuhan, M.D.

(Annual Breast Examination by a physician or other health care provider
(Annual Mammography Screening beginning at age 40
(Monthly Breast Self Examination

## 2015-08-06 IMAGING — MG MAMMO DIGITAL SCREENING
1 series · 6 of 6 positions shown · non-contrast
Comparison: 

------------- REPORT [id number] -------------
KEV, OSIRIS

MAMMO DIGITAL SCREENING WITH CAD
Exam:  
Screening digital mammogram with CAD
INDICATION: Annual screening.

[R CC · oblique · right · 6 of 6 slices shown]
[im 1/6]
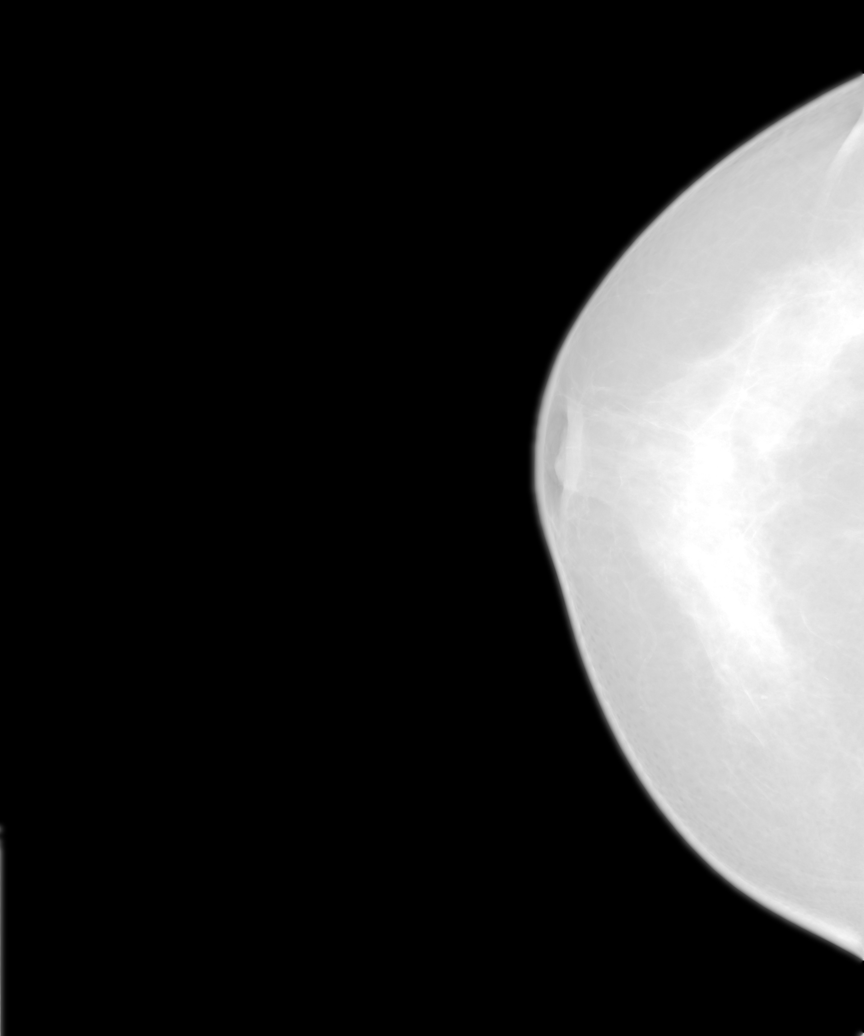
[im 2/6]
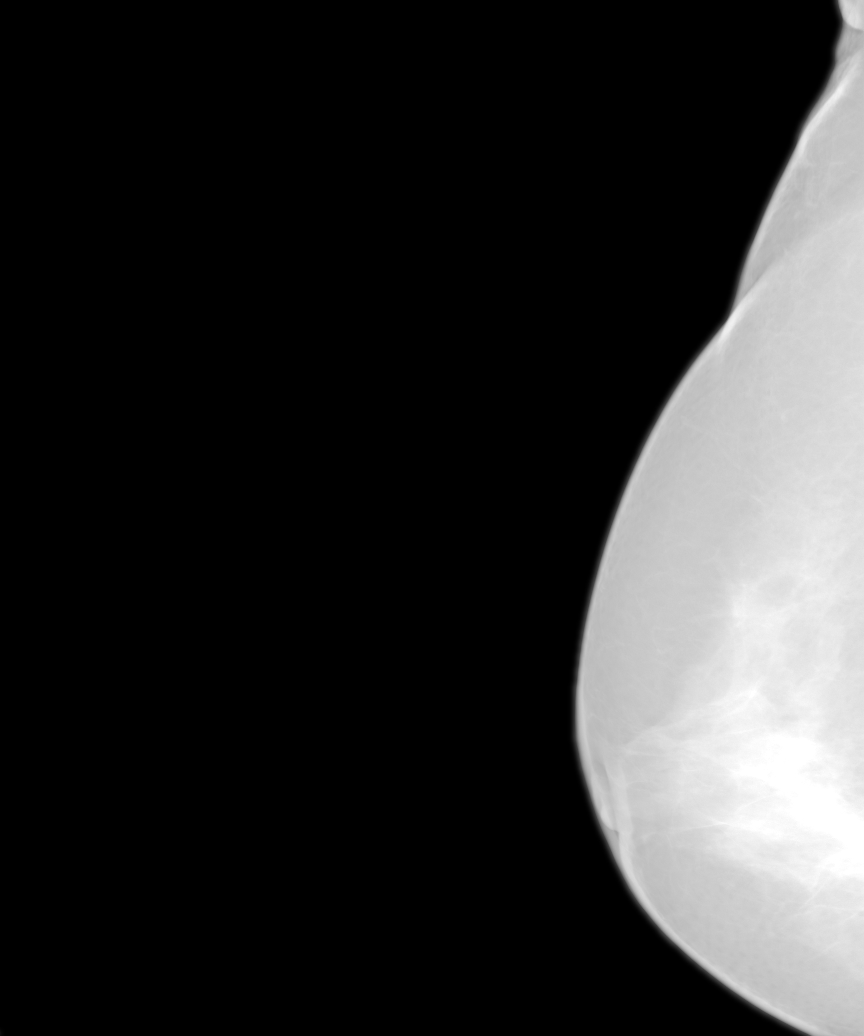
[im 3/6]
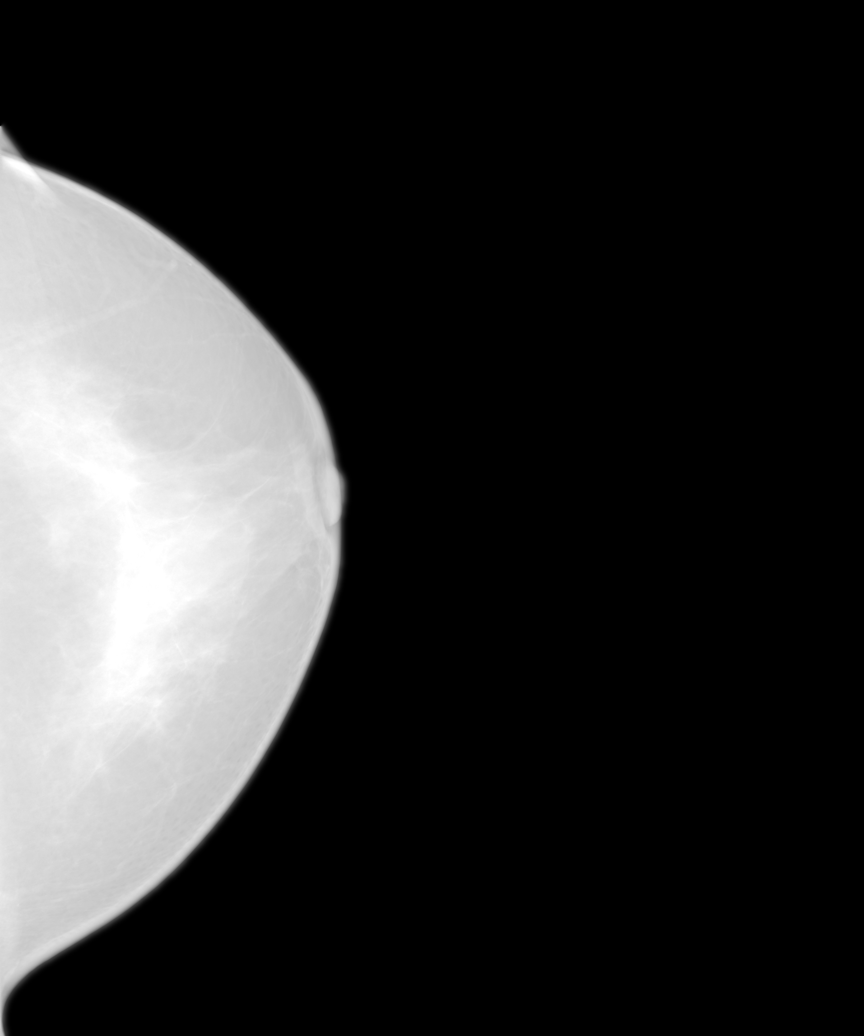
[im 4/6]
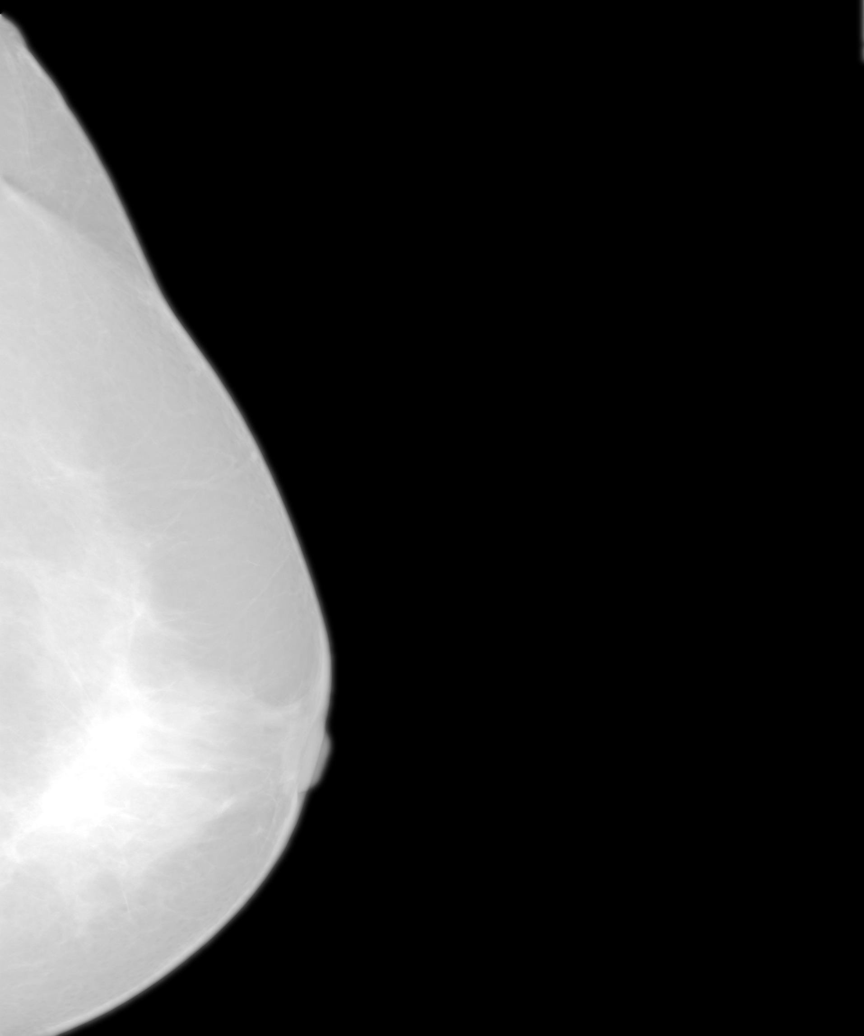
[im 5/6]
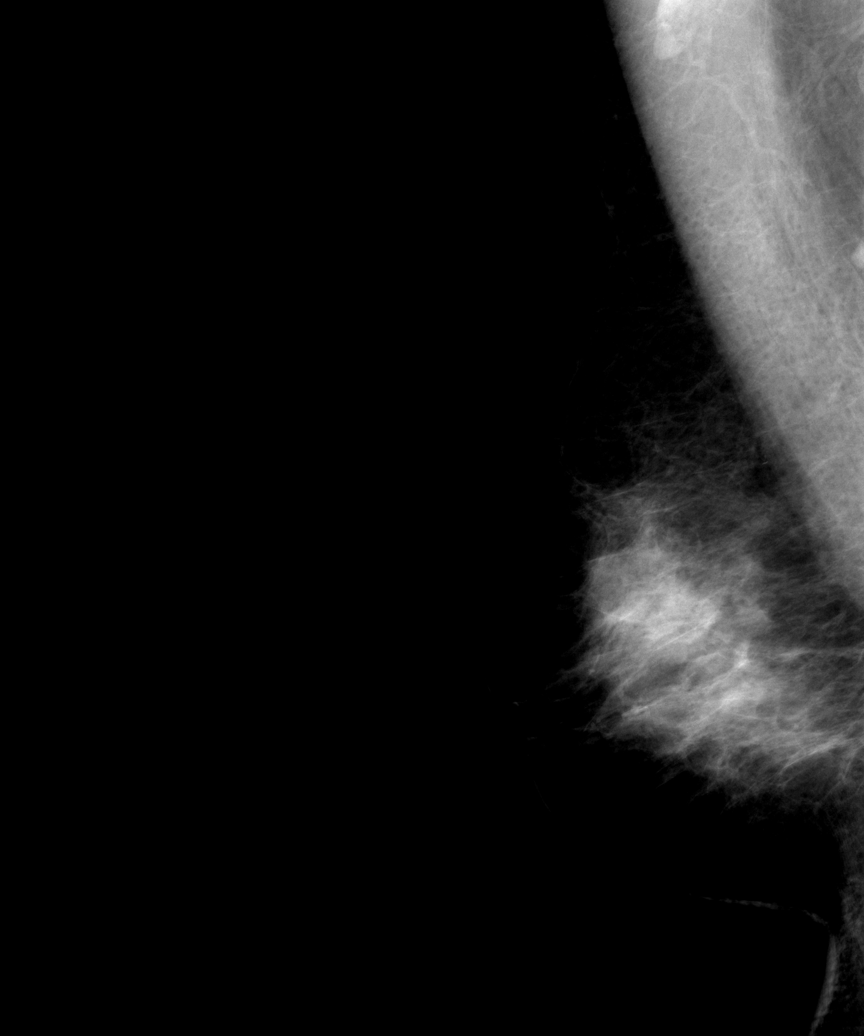
[im 6/6]
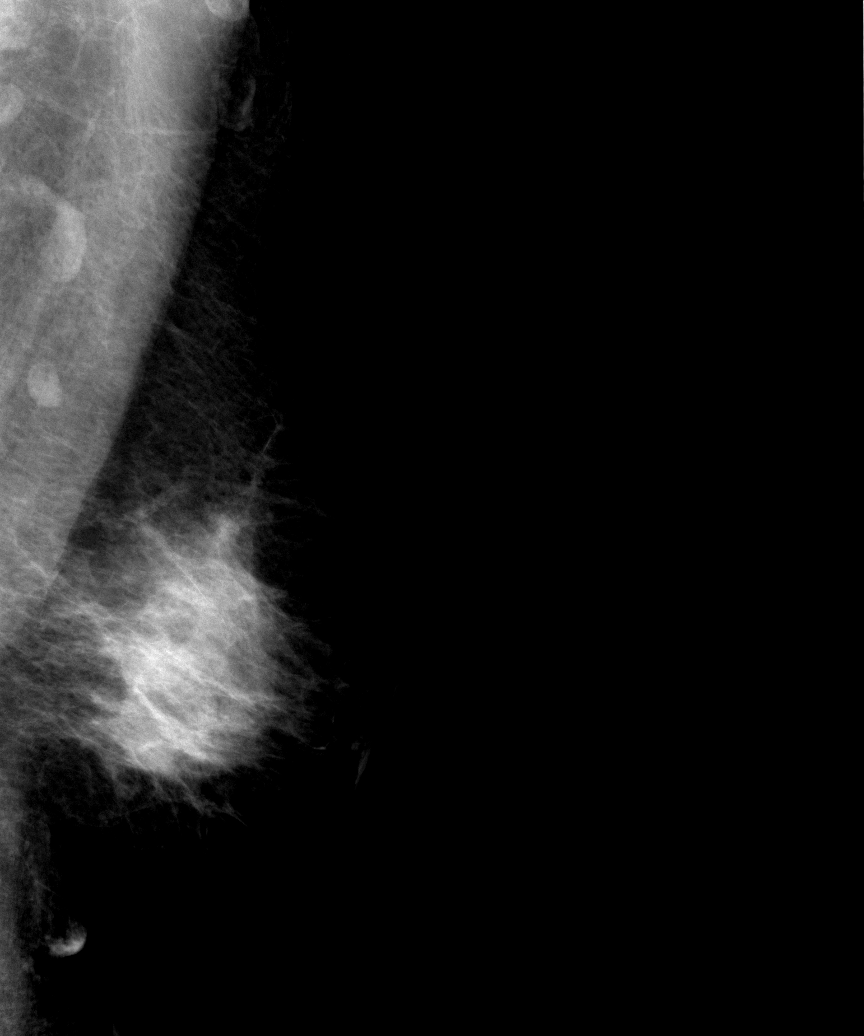

[6 of 6 positions shown; findings below may reference images not displayed]

FINDINGS: Breast parenchyma is heterogeneously dense. There is no mass or suspicious cluster of microcalcifications. There is no architectural distortion, skin thickening or nipple retraction.
IMPRESSION: 1.
BIRADS 2-Benign findings. Patient has been added in a reminder system with a
target date for the next screening mammography.
2.
DENSITY CODE   C (Heterogeneously dense)
Final Assessment Code:
Bi-Rads 2 

BI-RADS 0
Need additional imaging evaluation
BI-RADS 1
Negative mammogram
BI-RADS 2
Benign finding
BI-RADS 3
Probably benign finding: short-interval follow-up suggested
BI-RADS 4
Suspicious abnormality:  biopsy should be considered
BI-RADS 5
Highly suggestive of malignancy; appropriate action should be taken
BI-RADS 6
Known Biopsy-proven Malignancy  Appropriate action should be taken
NOTE:
In compliance with Federal regulations, the results of this mammogram are being sent to the patient.

------------- REPORT [id number] -------------
[facility]
[address]
We wish to report the following on your recent mammography examination. We are sending a report to your referring physician or other health care provider. 
(       Normal/Negative:
No evidence of cancer.
This statement is mandated by the [place], Department of Health.
Your examination was performed by one of our technologists, who are registered radiological technologists and also specially certified in mammography:
___
Frank, Zarina (M)
___
Huidrom, Rutik (M)

Your mammogram was interpreted by our radiologist.

( 
Jean Rousso Aniece, M.D.

(Annual Breast Examination by a physician or other health care provider
(Annual Mammography Screening beginning at age 40
(Monthly Breast Self Examination

## 2016-08-08 IMAGING — MG 3D SCREENING MAMMO BIL W/CAD
5 series · 8 of 24 positions shown · non-contrast
Comparison: 05/09/2016 and 05/08/2015.

------------- REPORT [id number] -------------
GALANO, RANKOVIC

Exam:  
Annual screening digital mammogram with 3D Tomosynthesis with CAD
INDICATION: Screening.

[R CC · right · 0.10mm/px · 2 of 2 slices shown]
[im 1/2]
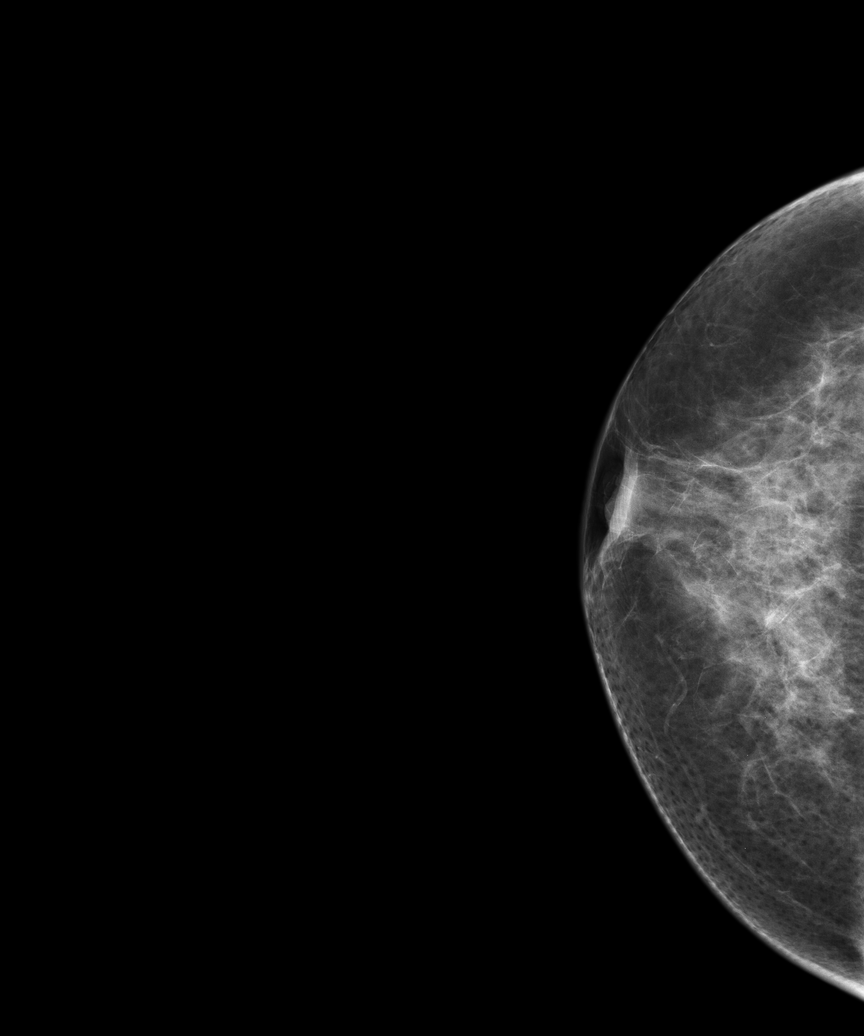
[im 2/2]
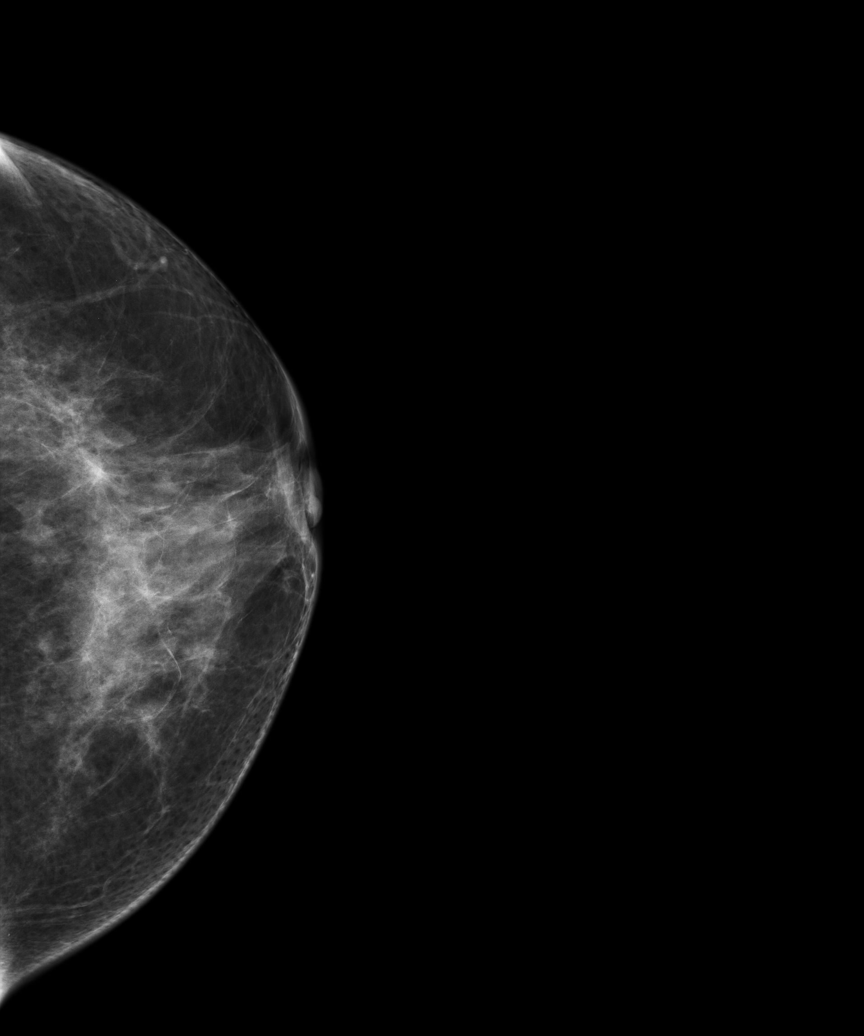

[3D SCREENING MAMMO BIL W/CAD · 2 acquisitions, 3 frames shown (1 of 2)]
[im 1/2]
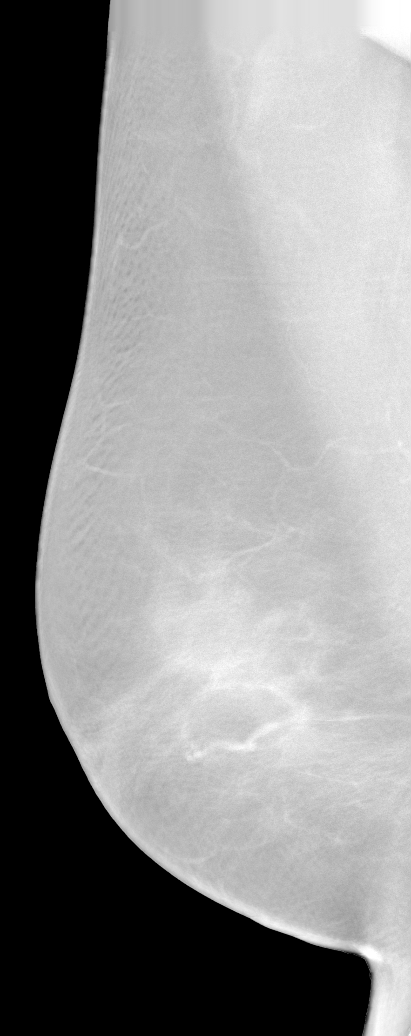
[im 2/2]
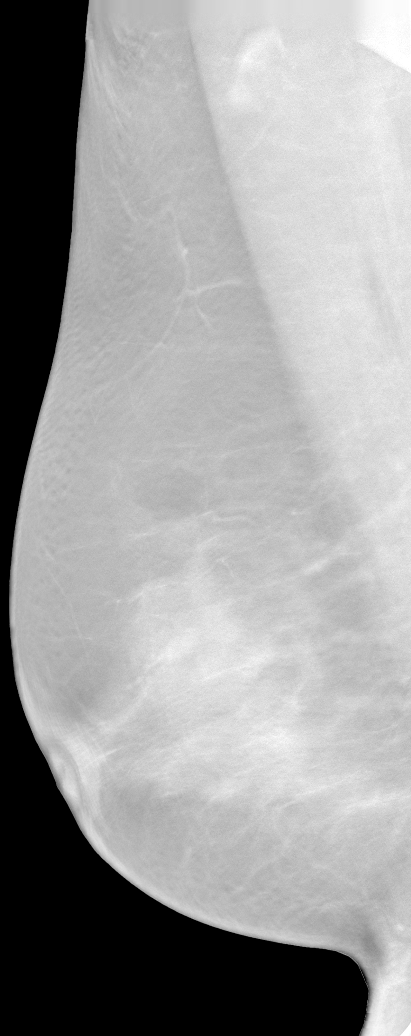
[im 2/2]
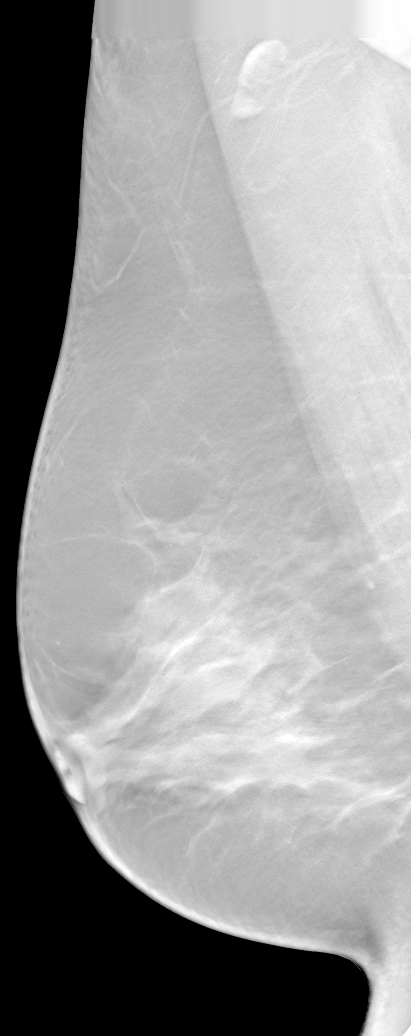

[3D SCREENING MAMMO BIL W/CAD (2 of 2) · tomo slice 11/70.0]
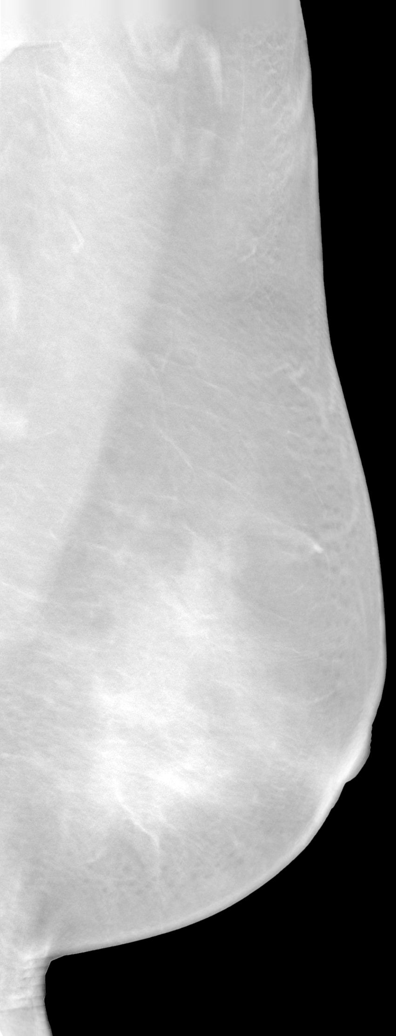

[R]
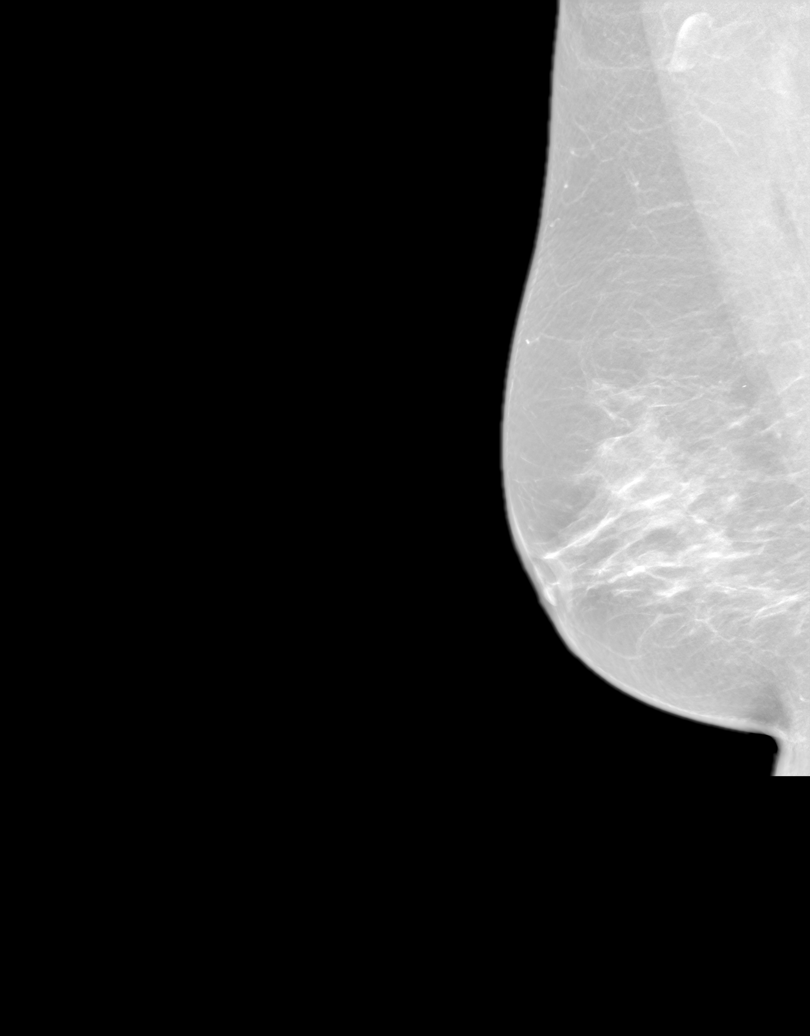

[L]
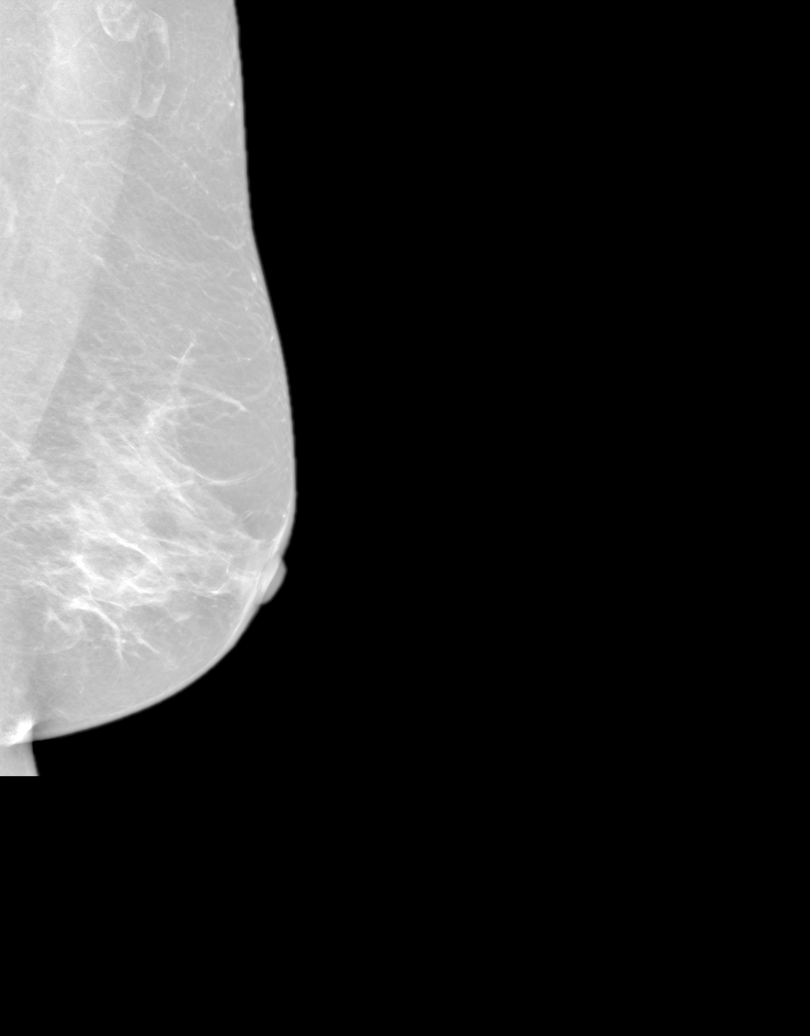

[8 of 24 positions shown; findings below may reference images not displayed]

FINDINGS: Breast parenchyma is heterogeneously dense. There is no mass or suspicious cluster of microcalcifications. There is no architectural distortion, skin thickening or nipple retraction.
IMPRESSION: BIRADS 2-Benign findings. Patient has been added in a reminder system with a

target date for the next screening mammography.

DENSITY CODE –  C (Heterogeneously dense). 

Final Assessment Code:

Bi-Rads 2 

BI-RADS 0
Need additional imaging evaluation

BI-RADS 1
Negative mammogram

BI-RADS 2
Benign finding

BI-RADS 3
Probably benign finding: short-interval follow-up suggested

BI-RADS 4
Suspicious abnormality:  biopsy should be considered

BI-RADS 5
Highly suggestive of malignancy; appropriate action should be taken

BI-RADS 6
Known Biopsy-proven Malignancy – Appropriate action should be taken

NOTE:
In compliance with Federal regulations, the results of this mammogram are being sent to the patient.

------------- REPORT [id number] -------------
[facility]
[address]
We wish to report the following on your recent mammography examination. We are sending a report to your referring physician or other health care provider. 
(       Normal/Negative:
No evidence of cancer.
This statement is mandated by the [place], Department of Health.
Your examination was performed by one of our technologists, who are registered radiological technologists and also specially certified in mammography:
___
Frank, Zarina (M)
___
Huidrom, Rutik (M)

Your mammogram was interpreted by our radiologist.

( 
Jean Rousso Aniece, M.D.

(Annual Breast Examination by a physician or other health care provider
(Annual Mammography Screening beginning at age 40
(Monthly Breast Self Examination

## 2017-08-09 IMAGING — MG 3D SCREENING MAMMO BIL W/CAD
5 series · 8 of 24 positions shown · non-contrast
Comparison: 11/16/2018 and 11/13/2017.

------------- REPORT [id number] -------------
[facility]
[address]
We wish to report the following on your recent mammography examination. We are sending a report to your referring physician or other health care provider. 
(       Normal/Negative:
No evidence of cancer.
This statement is mandated by [place] Department of Health.
Your examination was performed by one of our technologists, who are registered radiological technologists and also specially certified in mammography:
___
Frank, Zarina (M)
Huidrom, Rutik (M)

Your mammogram was interpreted by our radiologist.
( 
Jean Rousso Aniece, M.D.
(Annual Breast Examination by a physician or other health care provider
(Annual Mammography Screening beginning at age 40
(Monthly Breast Self Examination
------------- REPORT [id number] -------------
FONSEKA, TENAYE
EXAM:  3D BILATERAL ANNUAL SCREENING DIGITAL MAMMOGRAM WITH TOMOSYNTHESIS WITH CAD
INDICATION: Screening.

[R CC · right · 0.10mm/px · 2 of 3 slices shown]
[im 1/3]
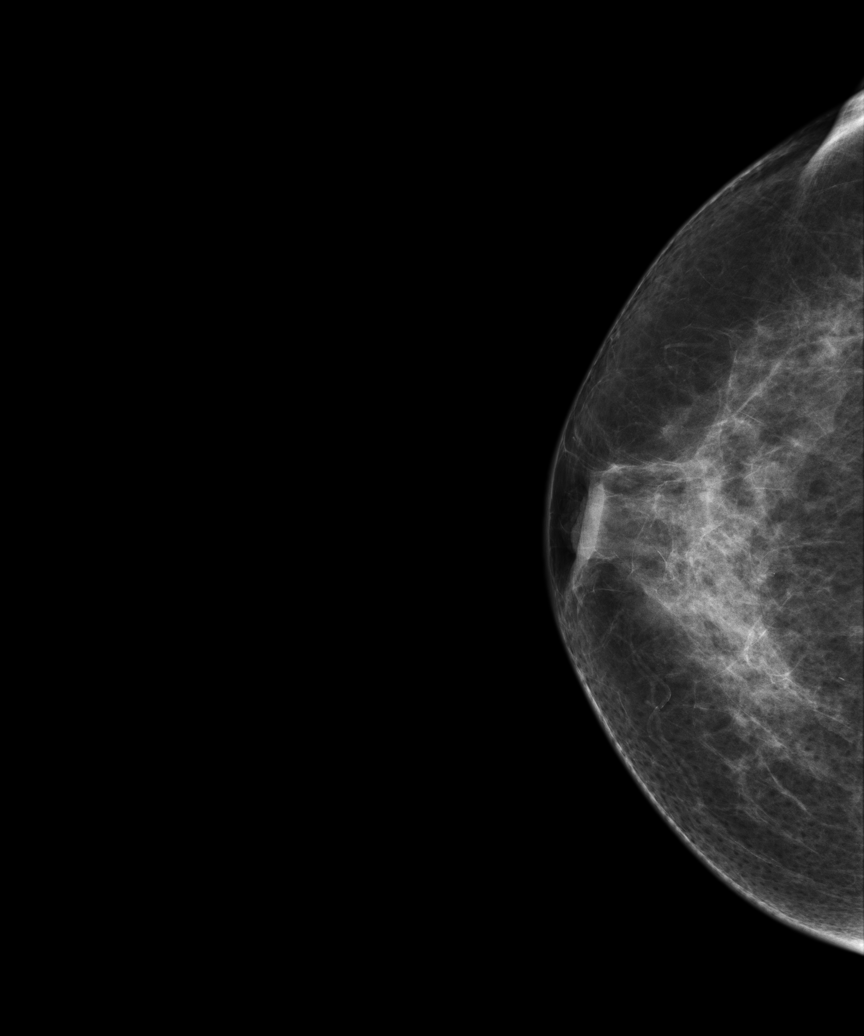
[im 3/3]
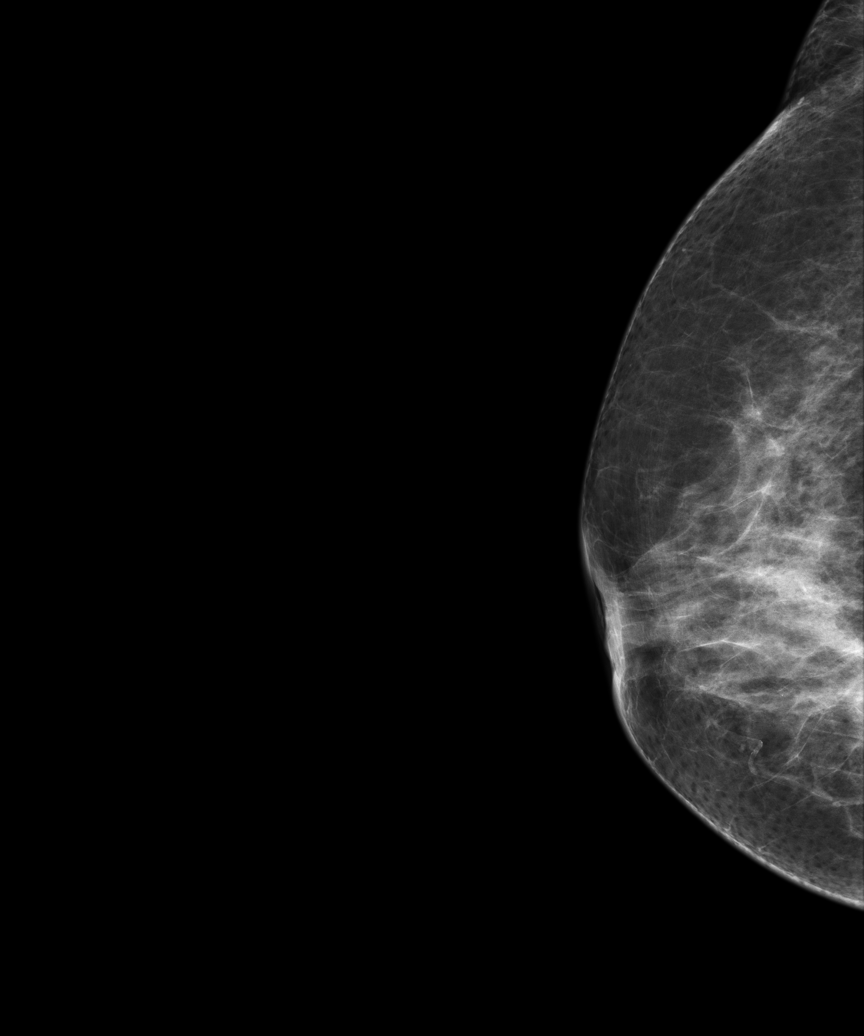

[3D SCREENING MAMMO BIL W/CAD · 2 acquisitions, 3 frames shown (1 of 2)]
[im 1/2]
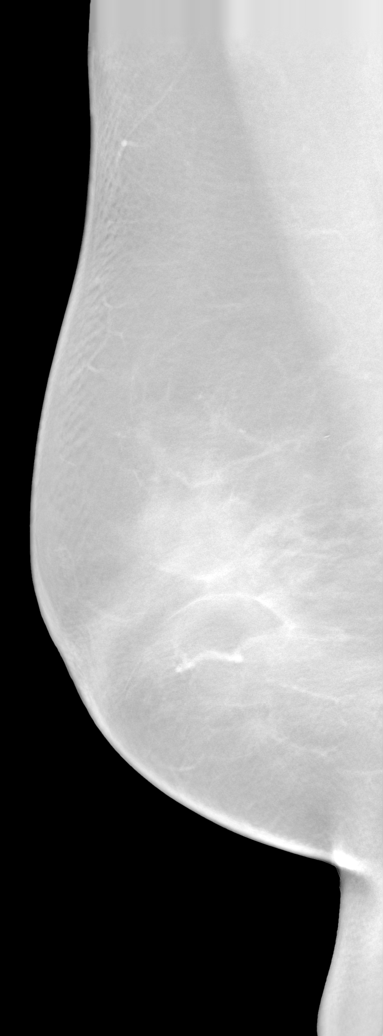
[im 2/2]
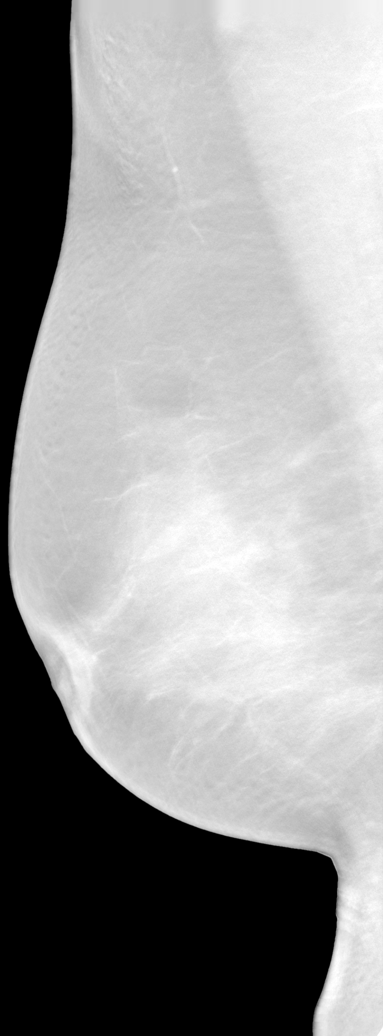
[im 2/2]
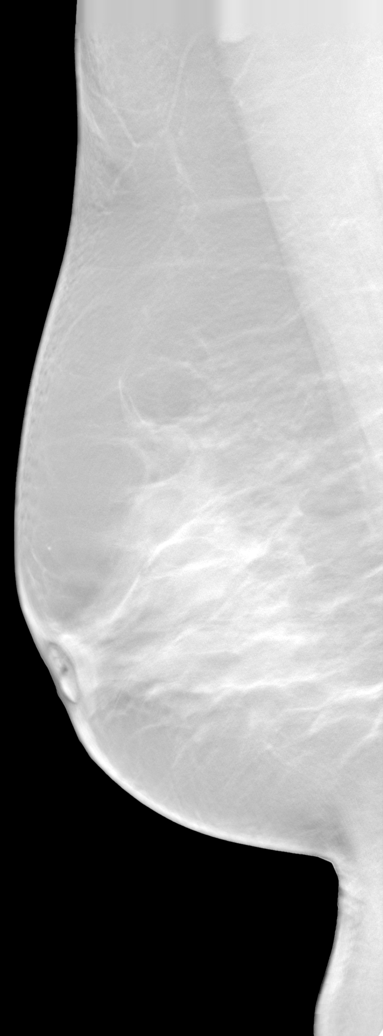

[R]
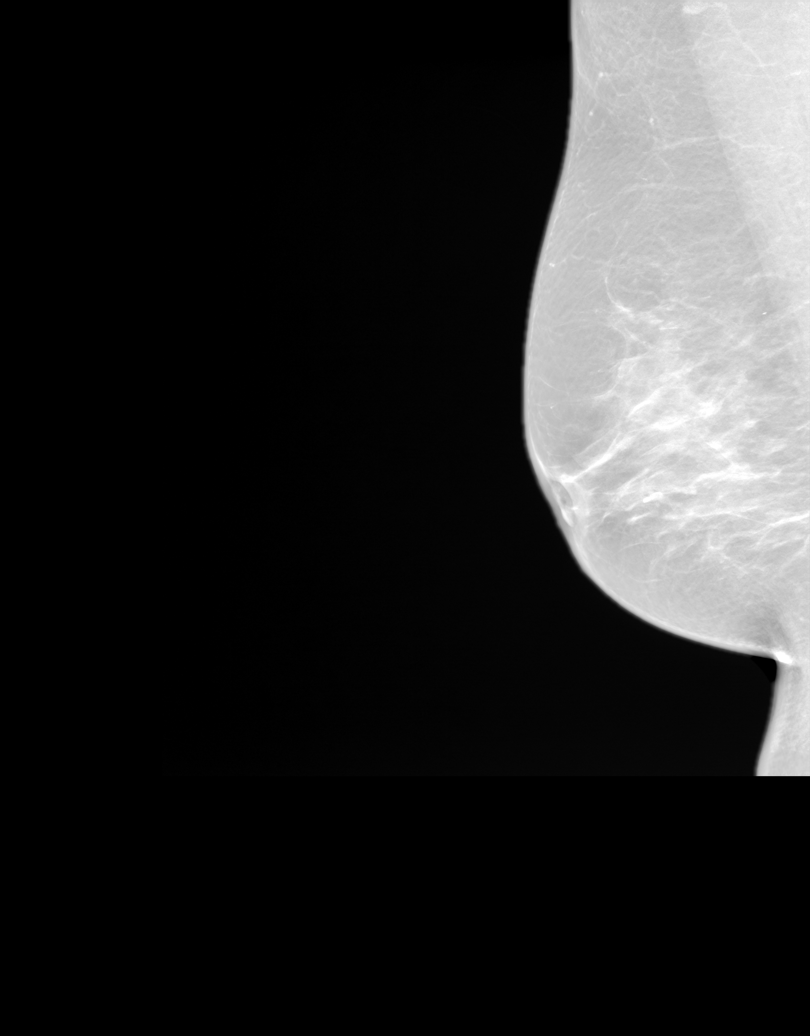

[3D SCREENING MAMMO BIL W/CAD (2 of 2) · tomo slice 12/72.0]
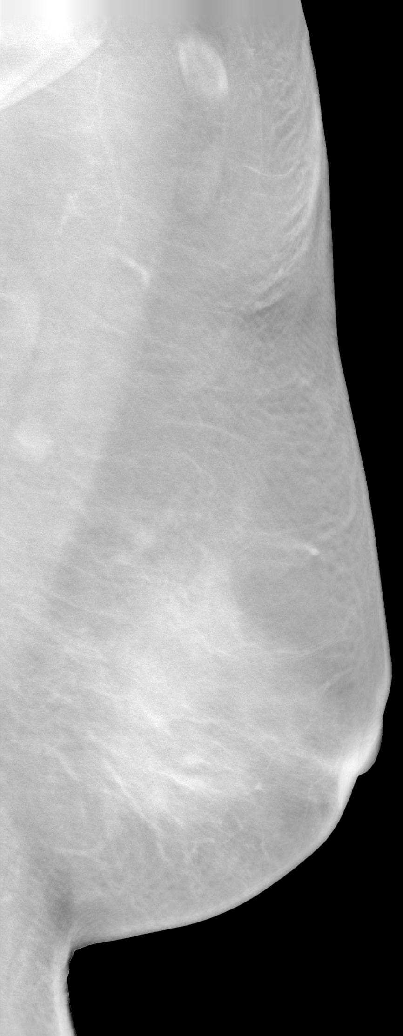

[L]
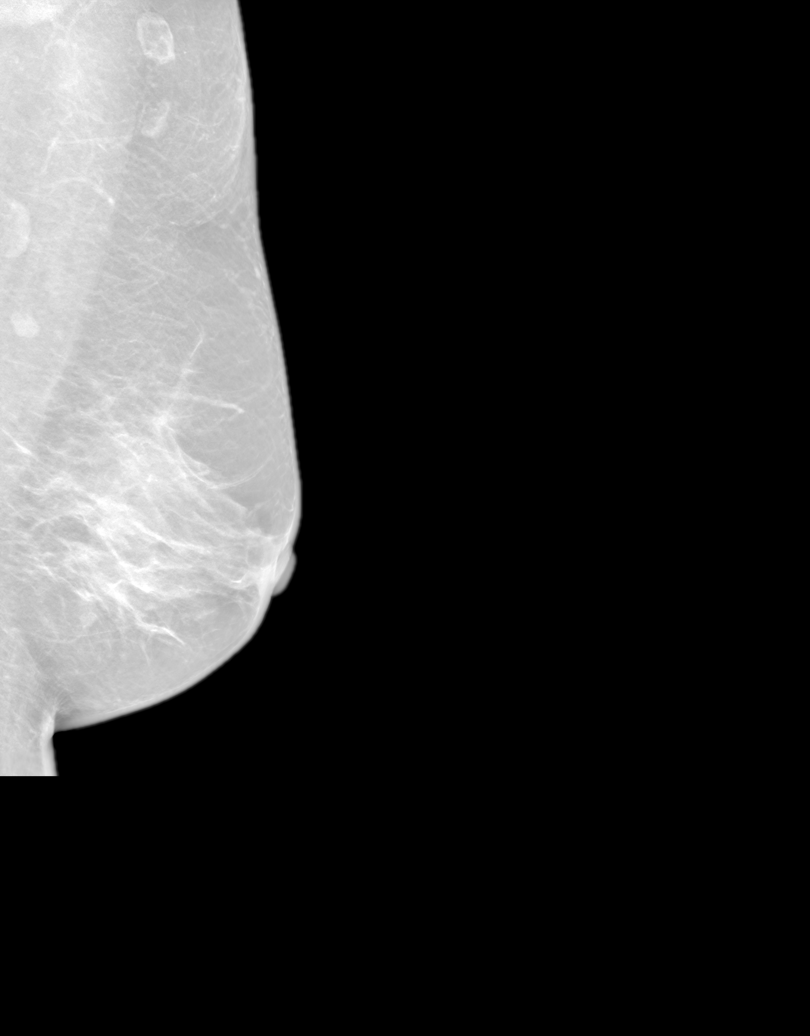

[8 of 24 positions shown; findings below may reference images not displayed]

FINDINGS: Breast parenchyma is heterogeneously dense.  There is no mass or suspicious cluster of microcalcifications.   There is no architectural distortion, skin thickening or nipple retraction.
IMPRESSION: 1.  BIRADS 2-Benign findings. Patient has been added in a reminder system with a target date for the next screening mammography.

2.  DENSITY CODE –  C (Heterogeneously dense). 

Final Assessment Code:

Bi-Rads 2 

BI-RADS 0
Need additional imaging evaluation

BI-RADS 1
Negative mammogram

BI-RADS 2
Benign finding

BI-RADS 3
Probably benign finding: short-interval follow-up suggested

BI-RADS 4
Suspicious abnormality:  biopsy should be considered

BI-RADS 5
Highly suggestive of malignancy; appropriate action should be taken

BI-RADS 6
Known Biopsy-proven Malignancy – Appropriate action should be taken

NOTE:
In compliance with Federal regulations, the results of this mammogram are being sent to the patient.

## 2018-08-10 IMAGING — MG 3D SCREENING MAMMO BIL W/CAD
5 series · 8 of 24 positions shown · non-contrast
Comparison: 02/22/2018 and 02/21/2017.

------------- REPORT [id number] -------------
[facility]
[address]
We wish to report the following on your recent mammography examination. We are sending a report to your referring physician or other health care provider. 
(       Normal/Negative:
No evidence of cancer.
This statement is mandated by the [place], Department of Health.
Your examination was performed by one of our technologists, who are registered radiological technologists and also specially certified in mammography:
___
Frank, Zarina (M)
Huidrom, Rutik (M)

Your mammogram was interpreted by our radiologist.
( 
Jean Rousso Aniece, M.D.
(Annual Breast Examination by a physician or other health care provider
(Annual Mammography Screening beginning at age 40
(Monthly Breast Self Examination
------------- REPORT [id number] -------------
DUDEK, JOON SUK
EXAM:  3D BILATERAL ANNUAL SCREENING DIGITAL MAMMOGRAM WITH TOMOSYNTHESIS AND CAD
INDICATION: Screening.

[R]
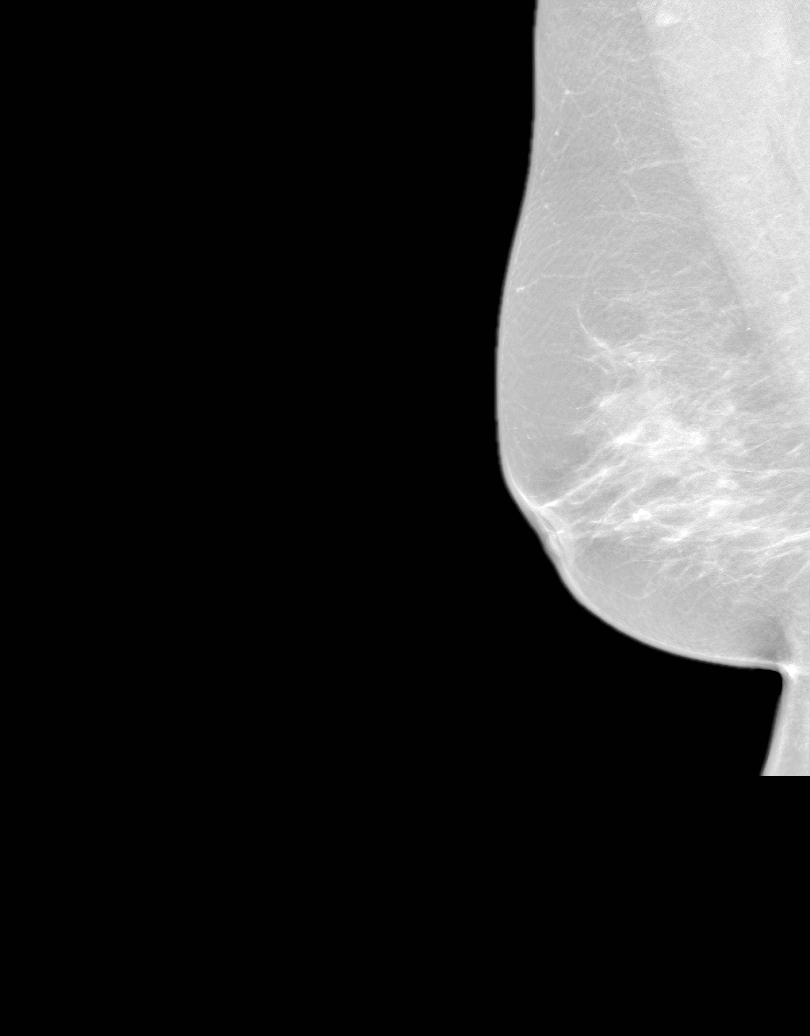

[R CC · right · 0.10mm/px · 2 of 2 slices shown]
[im 1/2]
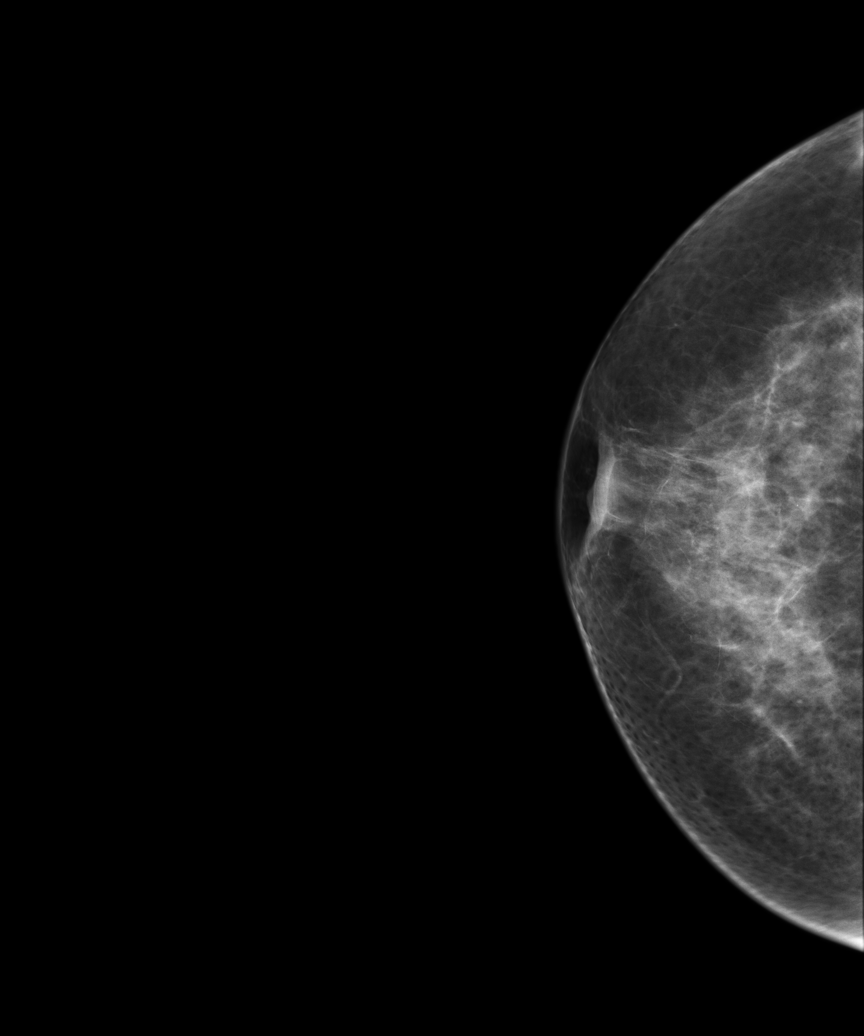
[im 2/2]
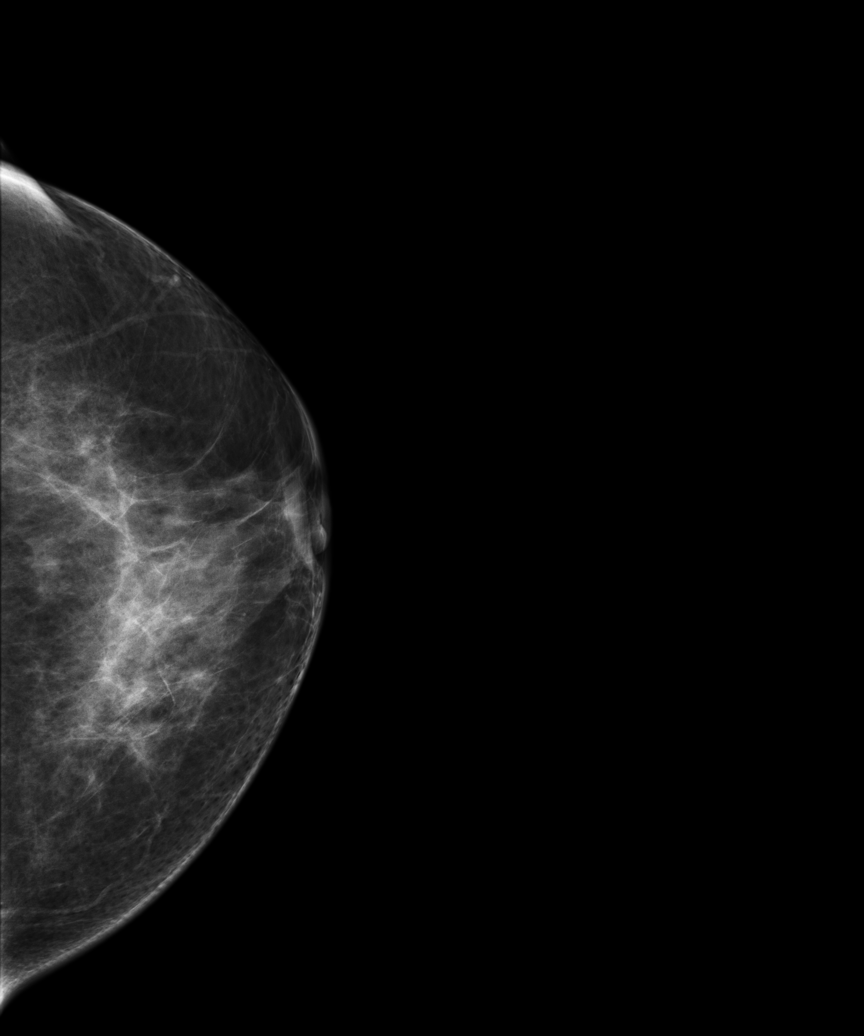

[3D SCREENING MAMMO BIL W/CAD · 2 acquisitions, 3 frames shown (1 of 2)]
[im 1/2]
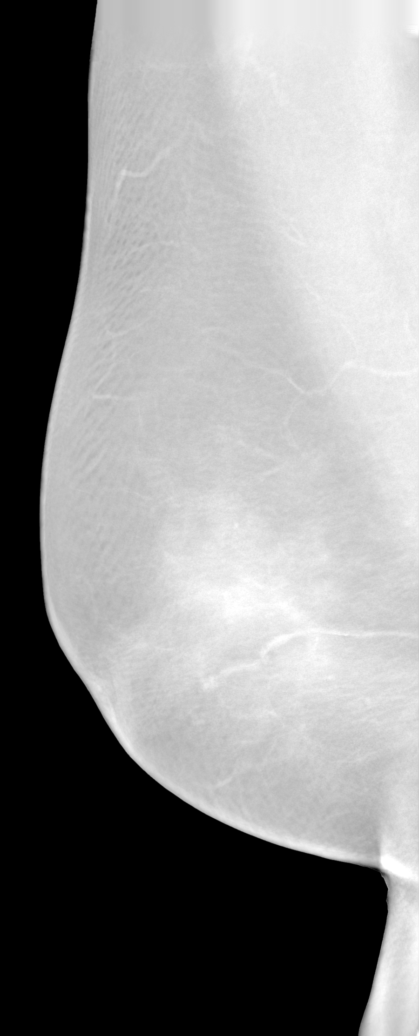
[im 2/2]
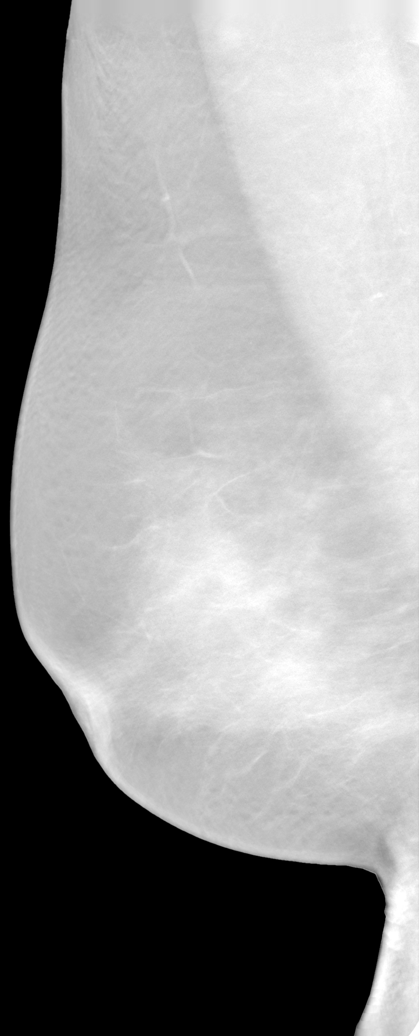
[im 2/2]
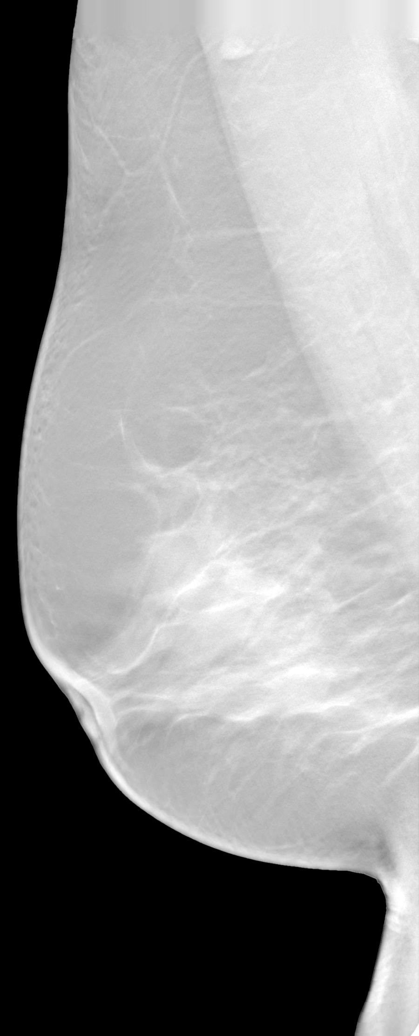

[3D SCREENING MAMMO BIL W/CAD (2 of 2) · tomo slice 12/74.0]
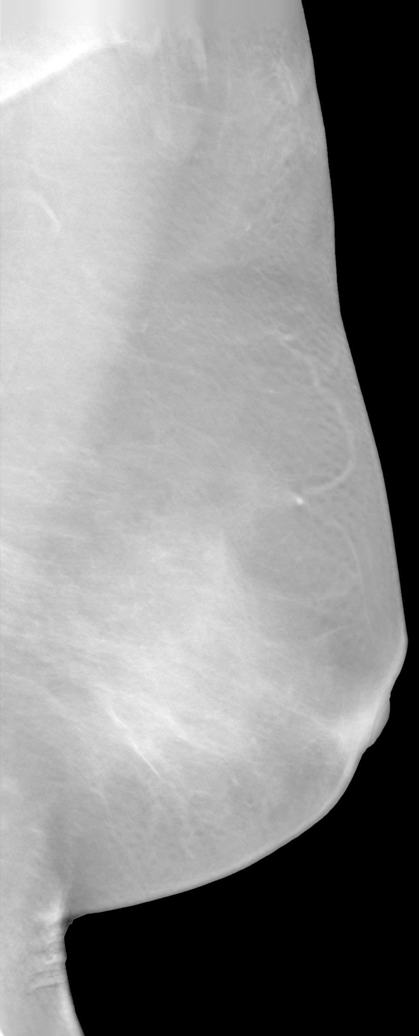

[L]
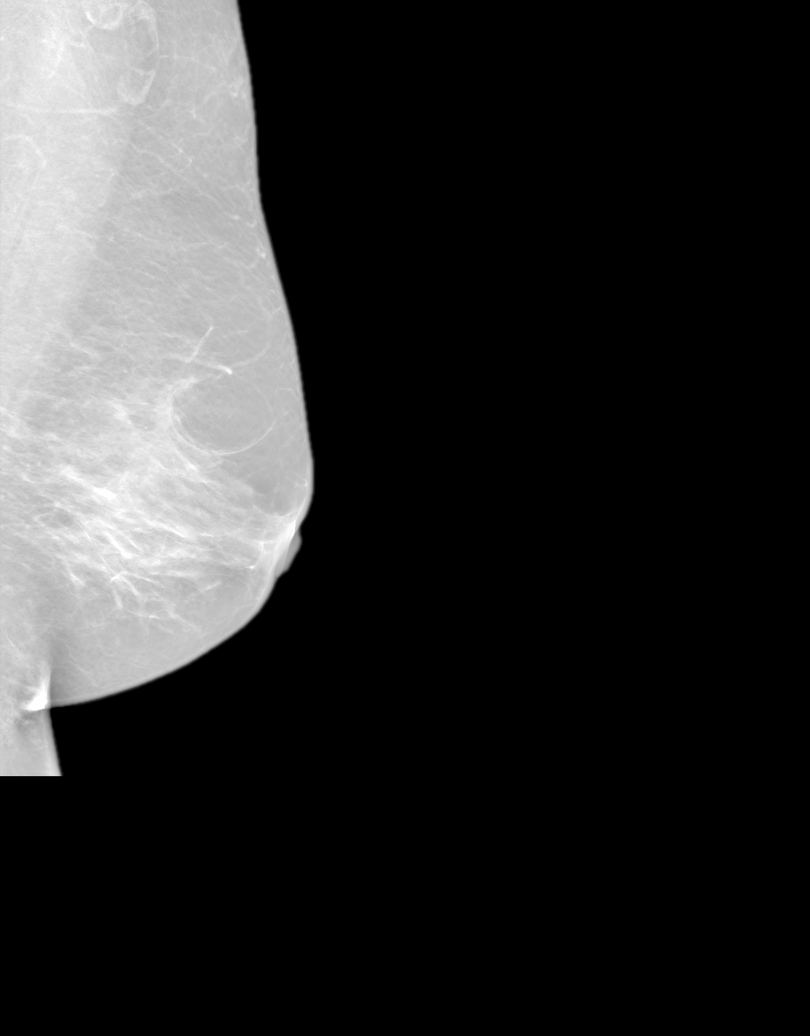

[8 of 24 positions shown; findings below may reference images not displayed]

FINDINGS: Breast parenchyma is heterogeneously dense.  There is no mass or suspicious cluster of microcalcifications.   There is no architectural distortion, skin thickening or nipple retraction.
IMPRESSION: 1.  BIRADS 2-Benign findings. Patient has been added in a reminder system with a target date for the next screening mammography.

2.  DENSITY CODE –  C (Heterogeneously dense). 

Final Assessment Code:

Bi-Rads 2 

BI-RADS 0
Need additional imaging evaluation

BI-RADS 1
Negative mammogram

BI-RADS 2
Benign finding

BI-RADS 3
Probably benign finding: short-interval follow-up suggested

BI-RADS 4
Suspicious abnormality:  biopsy should be considered

BI-RADS 5
Highly suggestive of malignancy; appropriate action should be taken

BI-RADS 6
Known Biopsy-proven Malignancy – Appropriate action should be taken

NOTE:
In compliance with Federal regulations, the results of this mammogram are being sent to the patient.

## 2019-08-15 IMAGING — MG 3D SCREENING MAMMO BIL W/CAD
5 series · 8 of 24 positions shown · non-contrast
Comparison: 05/30/2020 and 05/30/2019.

------------- REPORT [id number] -------------
[facility]
[address]
We wish to report the following on your recent mammography examination. We are sending a report to your referring physician or other health care provider. 
(       Normal/Negative:
No evidence of cancer.
This statement is mandated by [place] Department of Health.
Your examination was performed by one of our technologists, who are registered radiological technologists and also specially certified in mammography:
___
Frank, Zarina (M)
Sell, Pepper (M)

Your mammogram was interpreted by our radiologist.
( 
Jean Rousso Aniece, M.D.
(Annual Breast Examination by a physician or other health care provider
(Annual Mammography Screening beginning at age 40
(Monthly Breast Self Examination
------------- REPORT [id number] -------------
CULP, SADIYA
EXAM:  3D BILATERAL ANNUAL SCREENING DIGITAL MAMMOGRAM WITH TOMOSYNTHESIS AND CAD
INDICATION: Screening.

[R CC · right · 0.10mm/px · 2 of 2 slices shown]
[im 1/2]
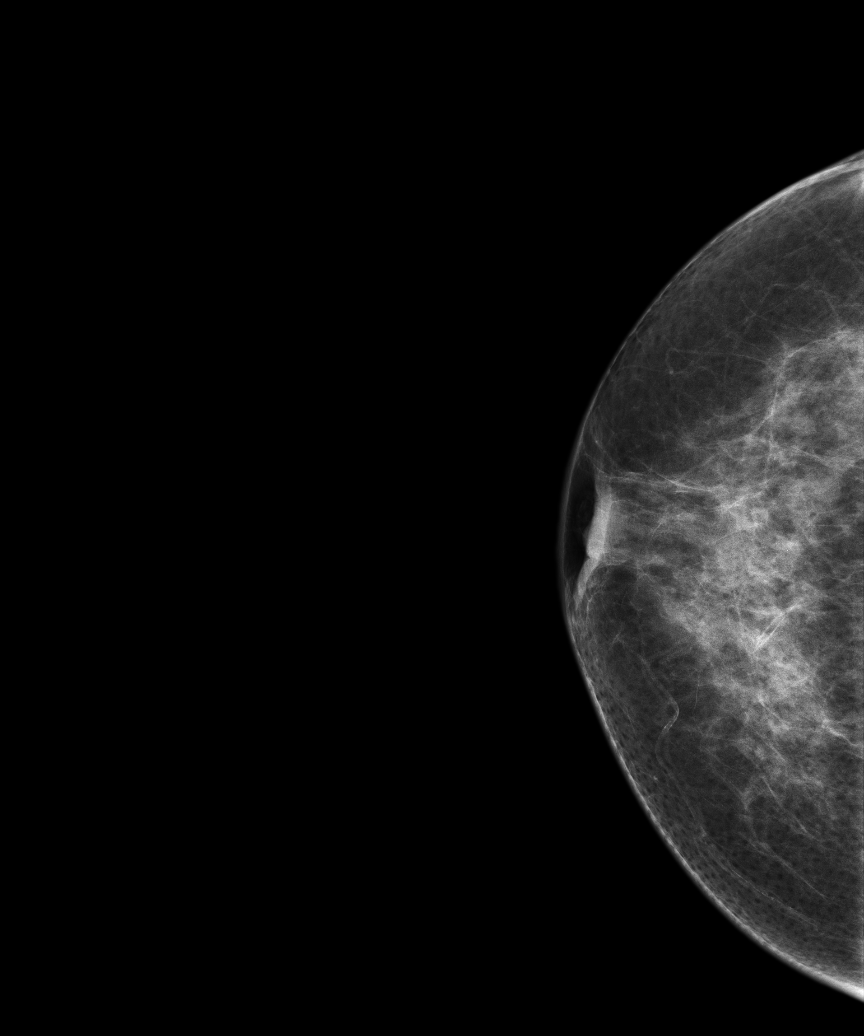
[im 2/2]
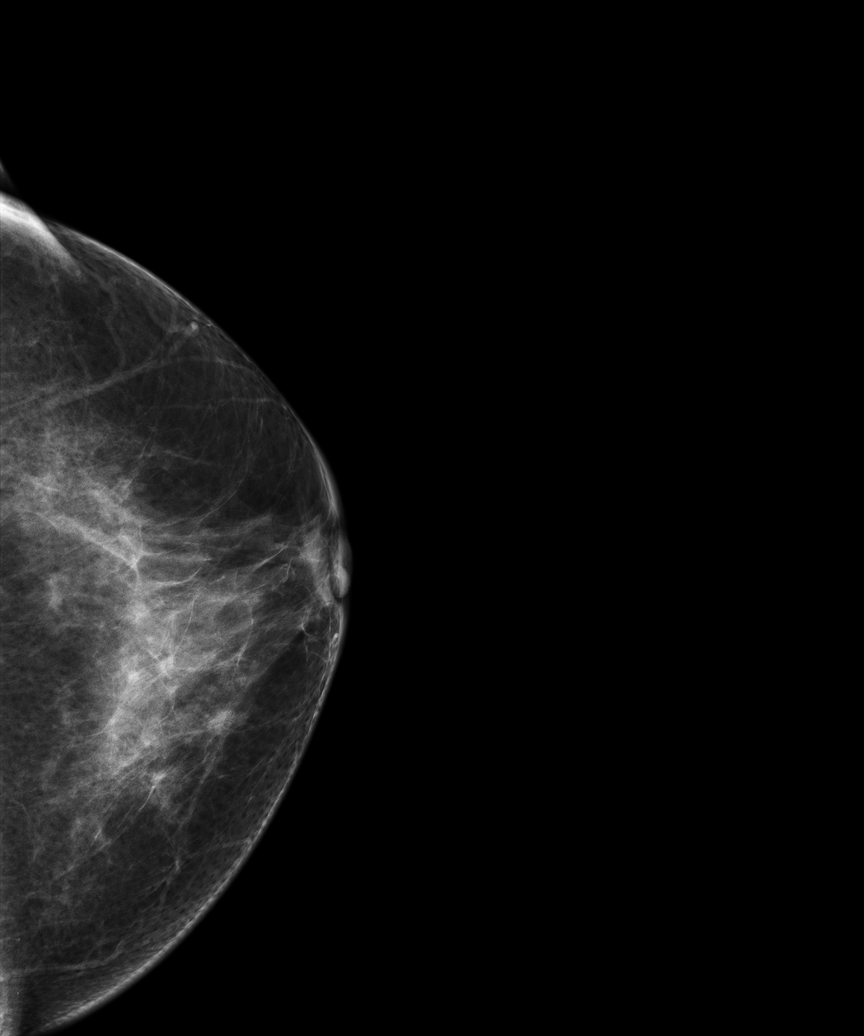

[3D SCREENING MAMMO BIL W/CAD · 2 acquisitions, 3 frames shown (1 of 2)]
[im 1/2]
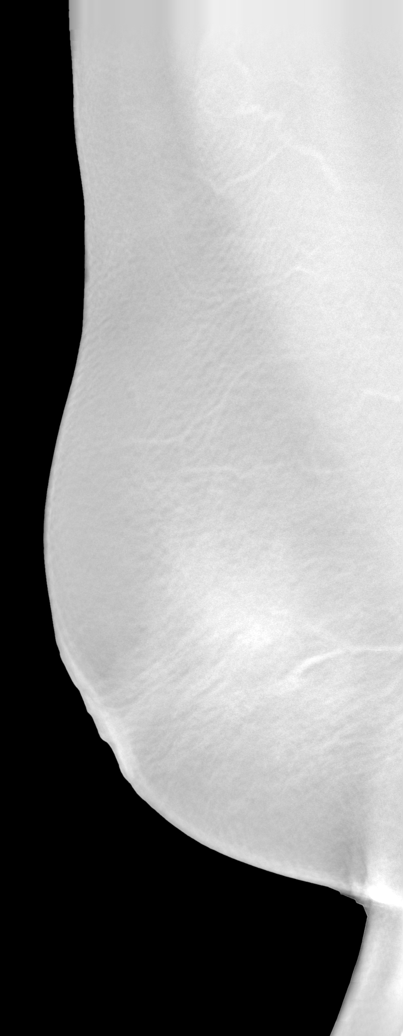
[im 2/2]
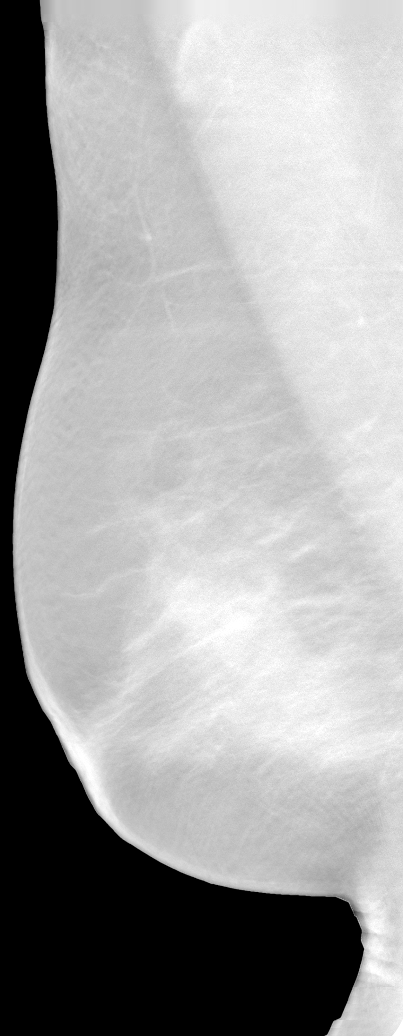
[im 2/2]
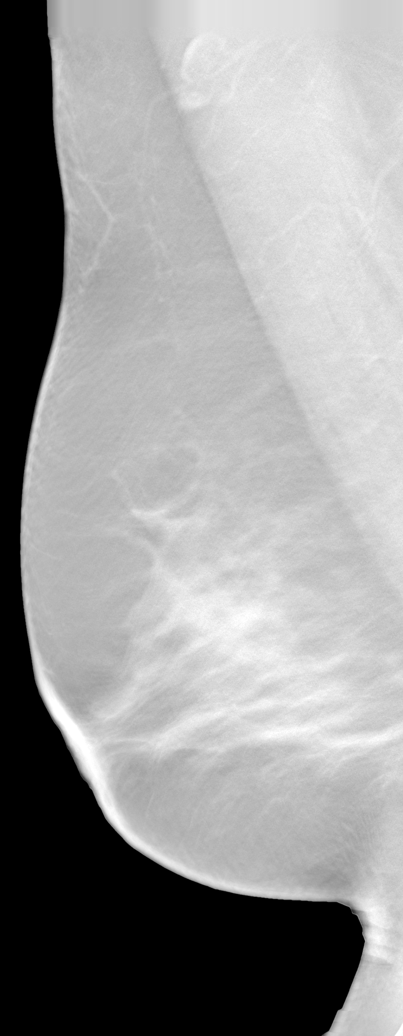

[R]
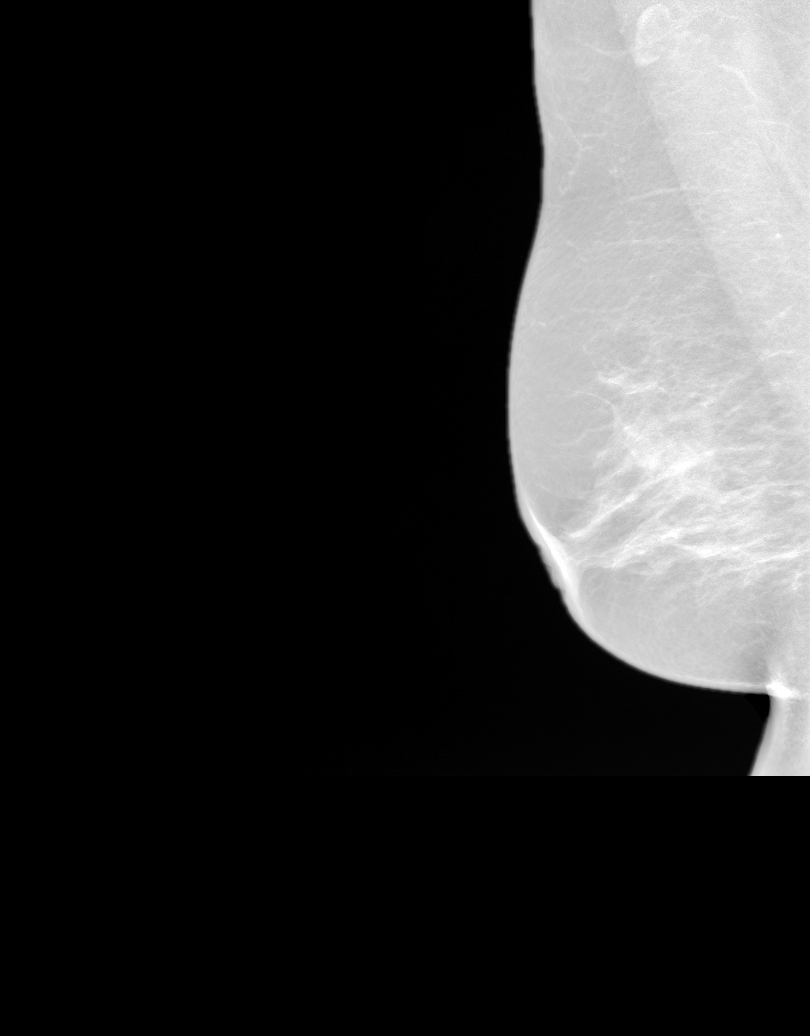

[3D SCREENING MAMMO BIL W/CAD (2 of 2) · tomo slice 12/77.0]
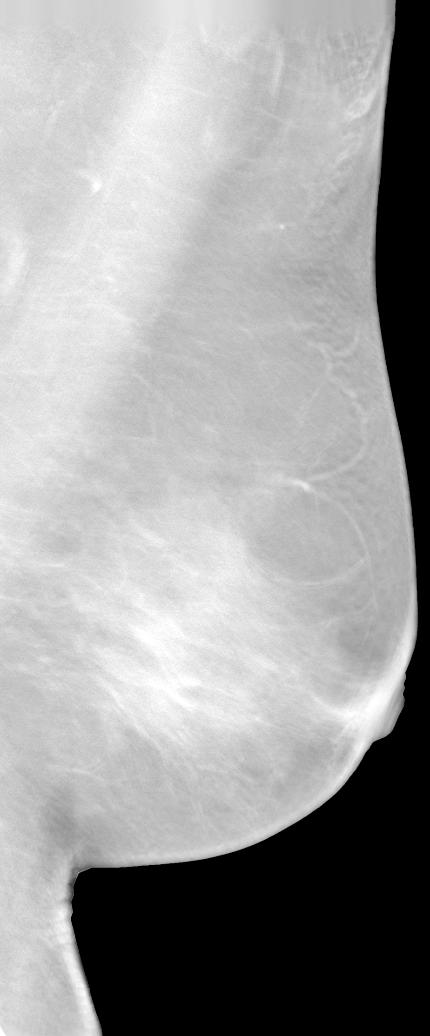

[L]
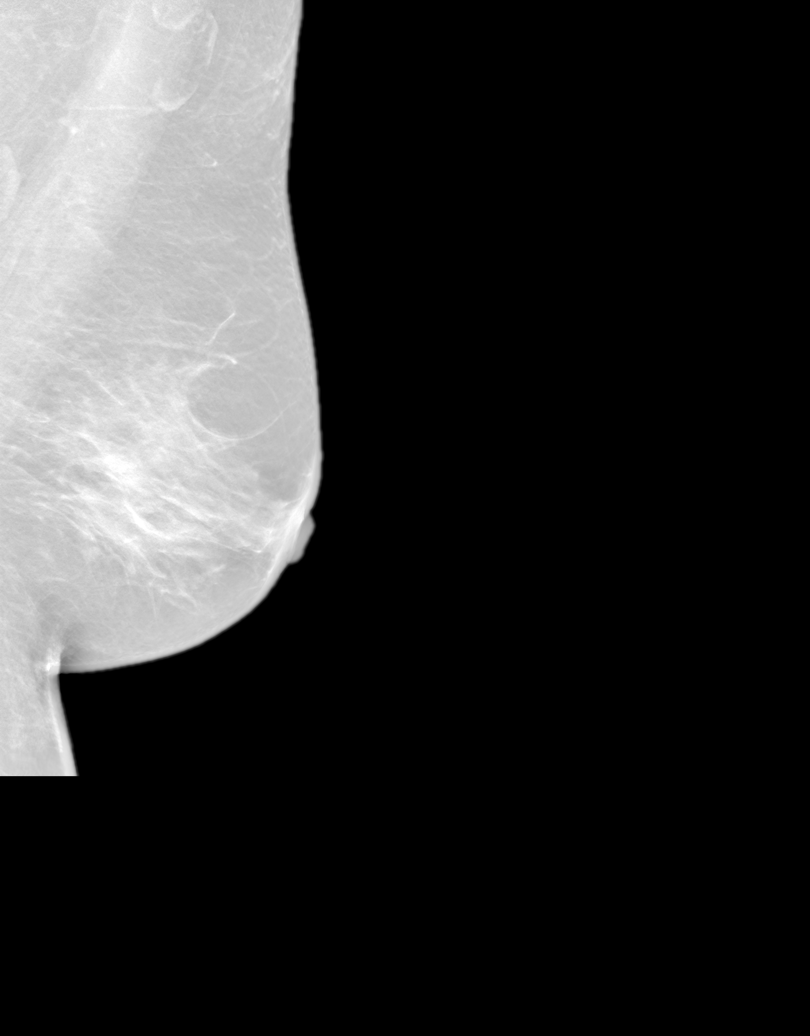

[8 of 24 positions shown; findings below may reference images not displayed]

FINDINGS: Breast parenchyma is heterogeneously dense.  There is no mass or suspicious cluster of microcalcifications.   There is no architectural distortion, skin thickening or nipple retraction.
IMPRESSION: 1.  BIRADS 2-Benign findings. Patient has been added in a reminder system with a target date for the next screening mammography.

2.  DENSITY CODE –  C (Heterogeneously dense).

Final Assessment Code:

Bi-Rads 2 

BI-RADS 0
Need additional imaging evaluation

BI-RADS 1
Negative mammogram

BI-RADS 2
Benign finding

BI-RADS 3
Probably benign finding; short-interval follow-up suggested

BI-RADS 4
Suspicious abnormality; biopsy should be considered

BI-RADS 5
Highly suggestive of malignancy; appropriate action should be taken

BI-RADS 6
Known biopsy-proven malignancy; appropriate action should be taken

NOTE:
In compliance with Federal regulations, the results of this mammogram are being sent to the patient.

## 2019-10-15 IMAGING — CR XRAY HIP W/PELVIS UNIL 2-3 VIEWS
1 series · 2 of 2 positions shown · non-contrast
Comparison: None available.

EXAM:  XRAY HIP W/PELVIS UNIL 2-3 VIEWS
INDICATION: Right hip pain.

[Series 1: view not recorded · 0.17mm/px · 2 of 2 slices shown]
[im 1/2]
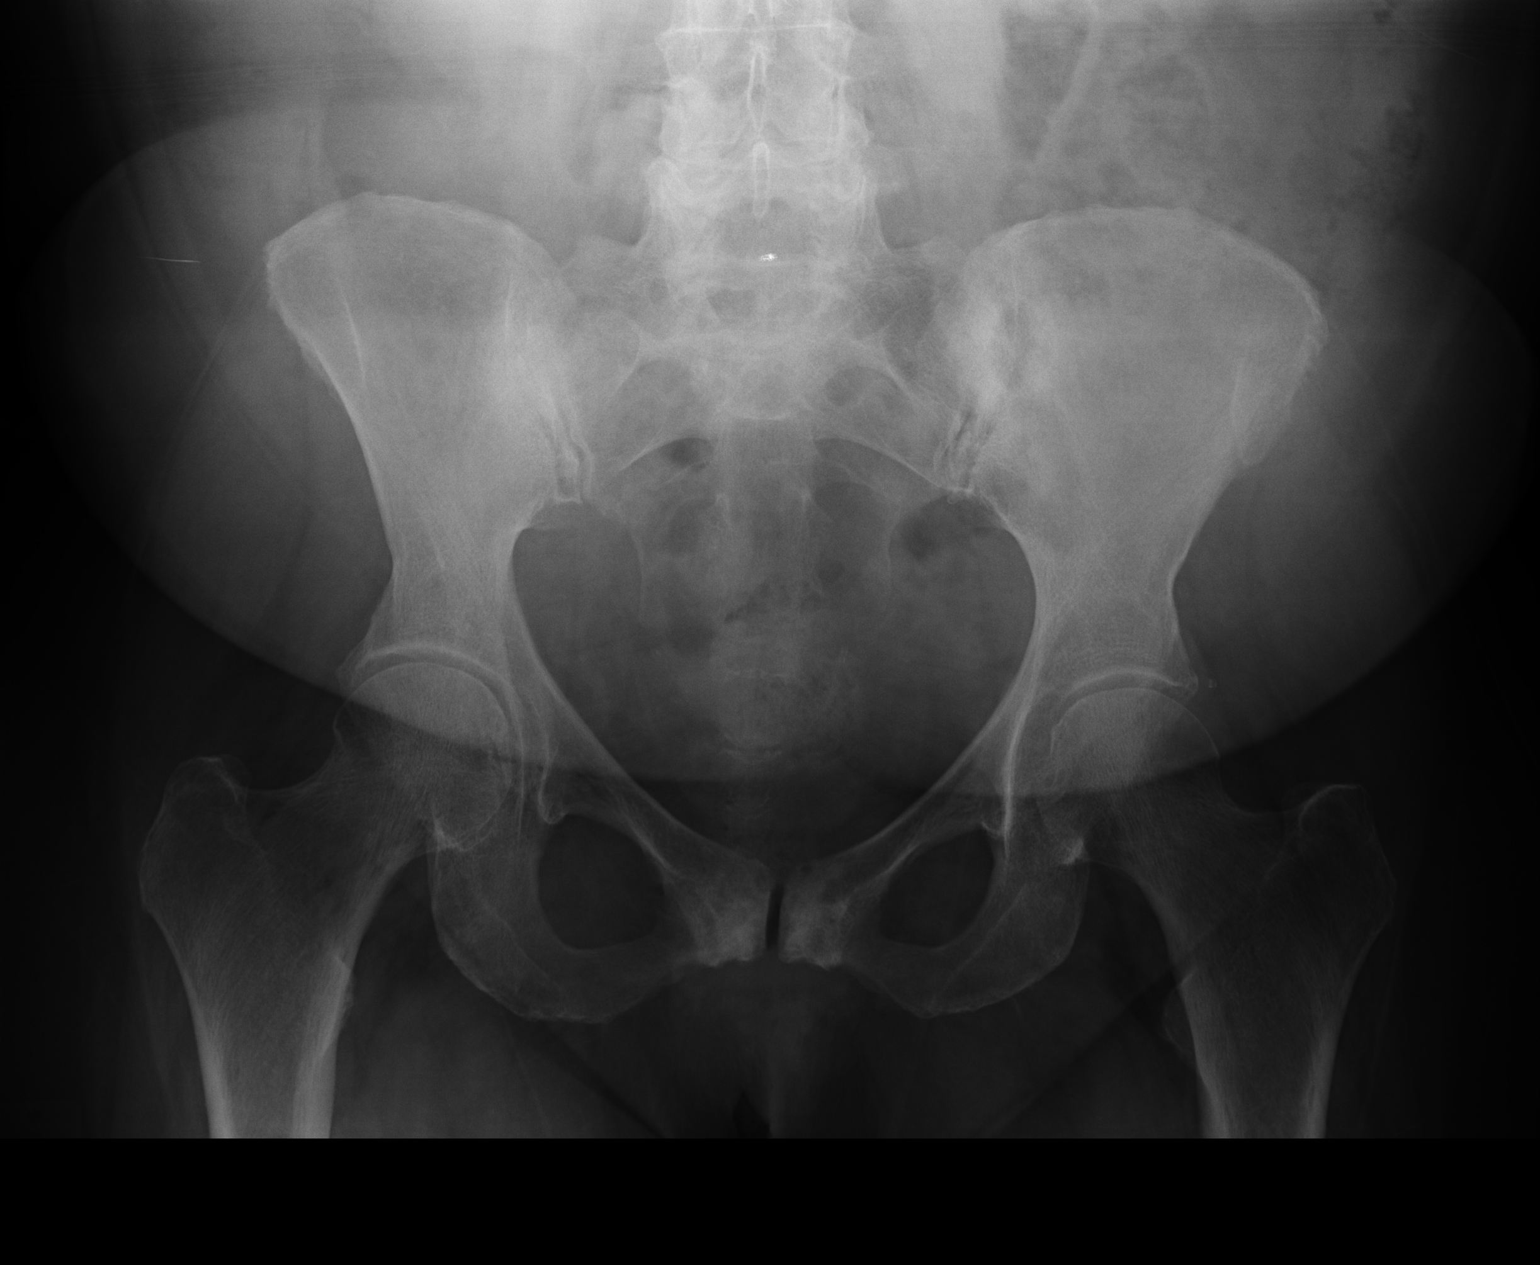
[im 2/2]
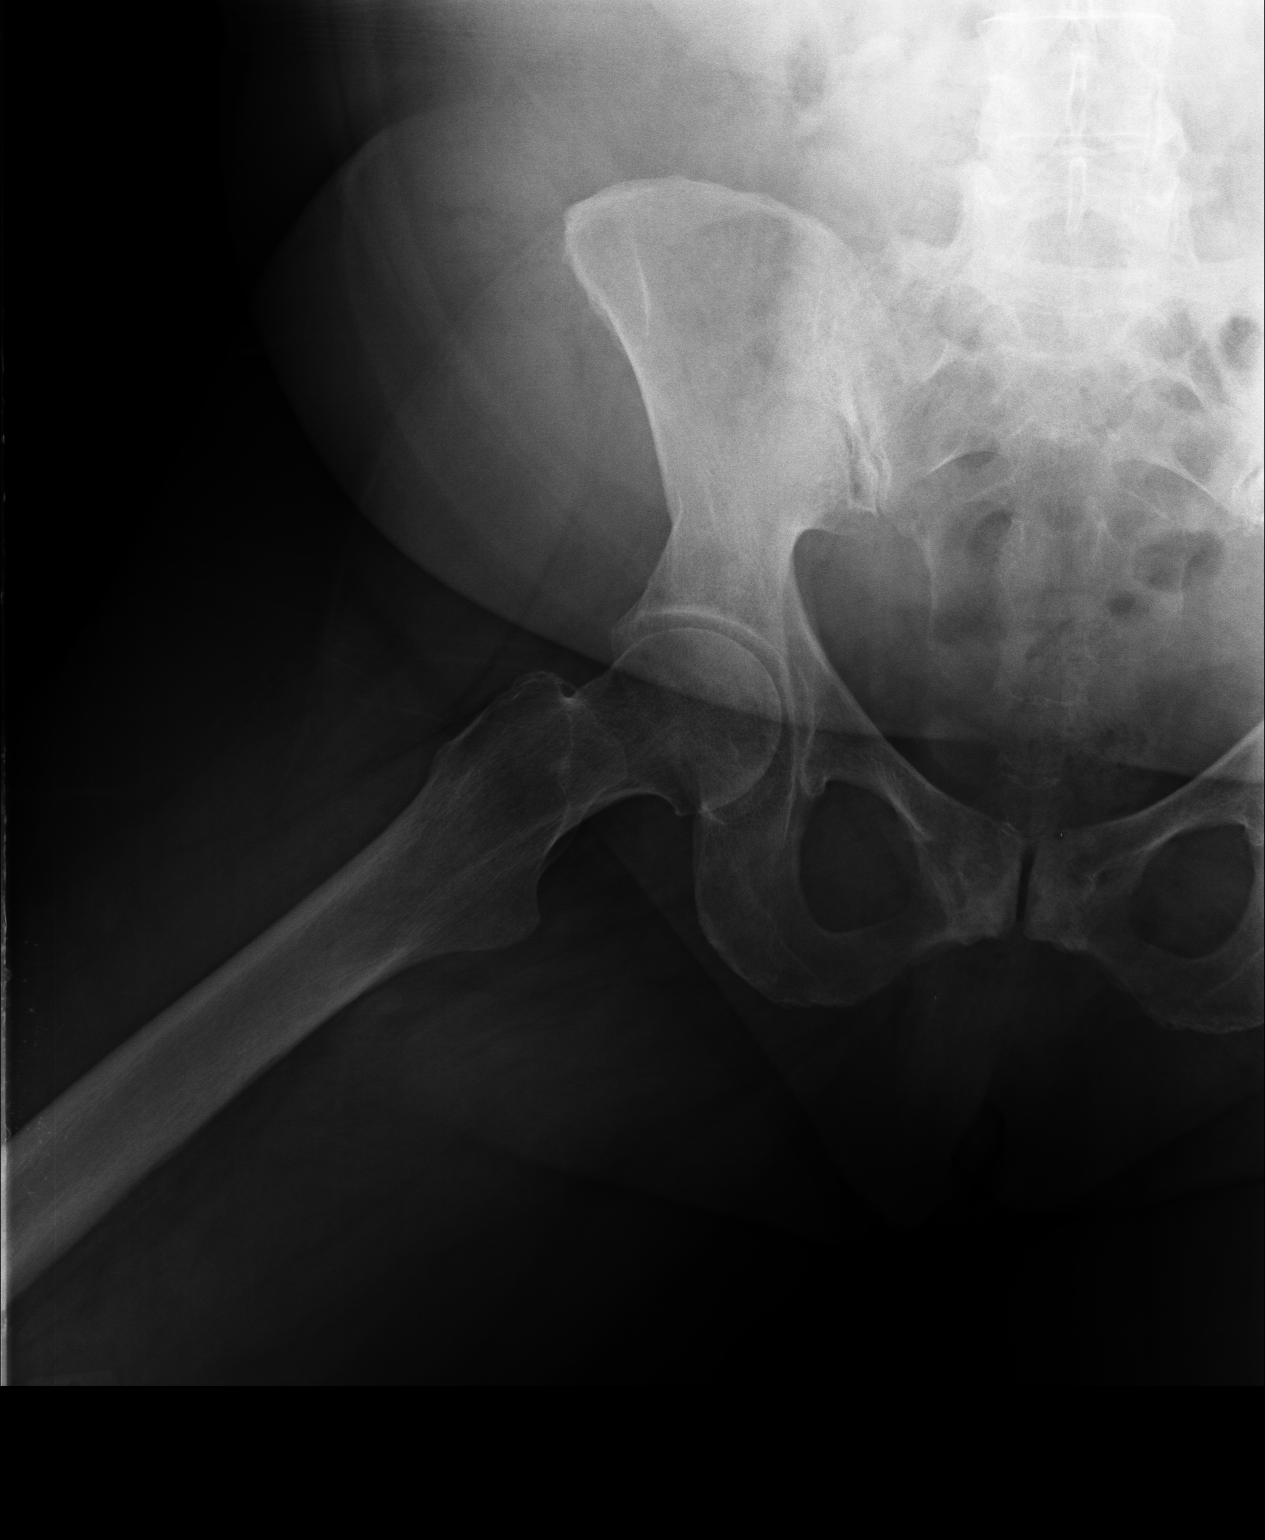

[2 of 2 positions shown; findings below may reference images not displayed]

FINDINGS: There is no acute fracture or subluxation. No significant arthritic changes seen within the hip joints. There is moderate osteoarthritis of the symphysis pubis. Surrounding soft tissues are unremarkable.
IMPRESSION: Moderate osteoarthritis of the symphysis pubis. Unremarkable hip joints.

## 2019-10-15 IMAGING — CR XRAY LUMBAR SPINE [DATE] VIEWS
1 series · 3 of 3 positions shown · non-contrast
Comparison: None available.

EXAM:  XRAY LUMBAR SPINE [DATE] VIEWS
INDICATION: Lower back pain.

[Series 3: view not recorded · 0.17mm/px · 3 of 3 slices shown]
[im 1/3]
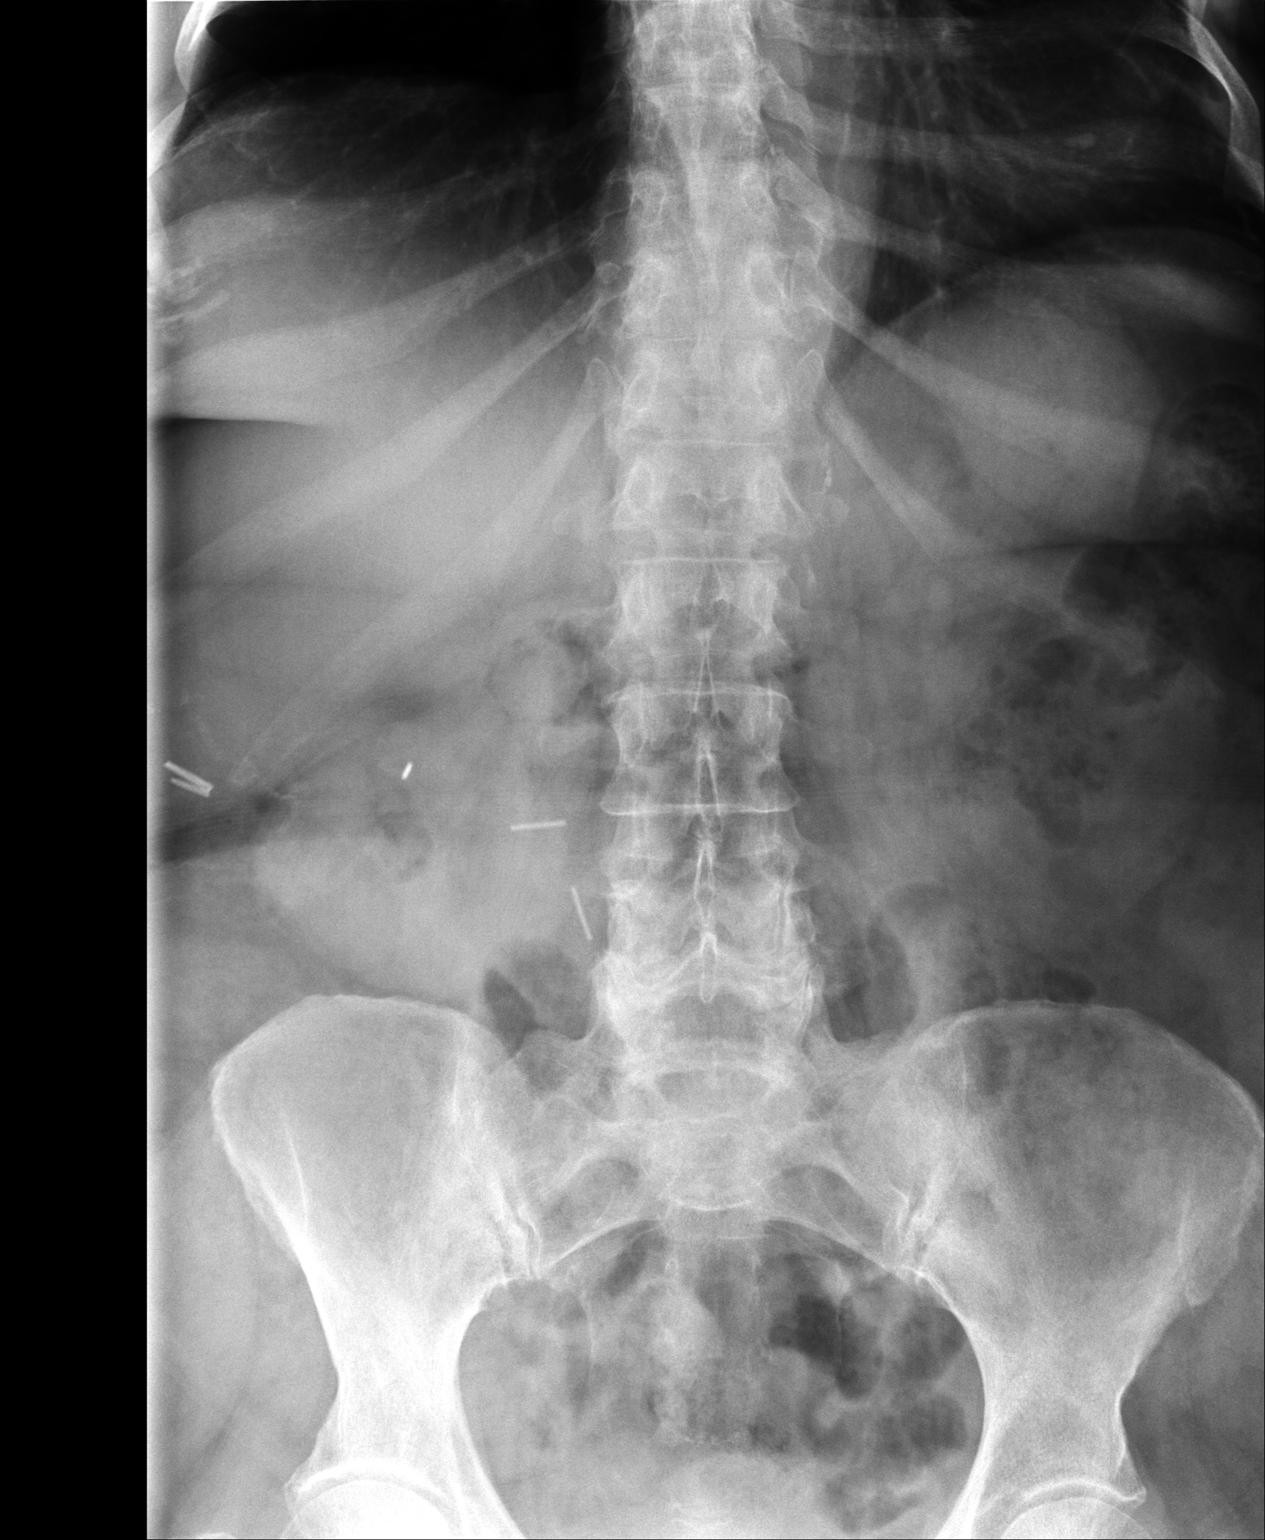
[im 2/3]
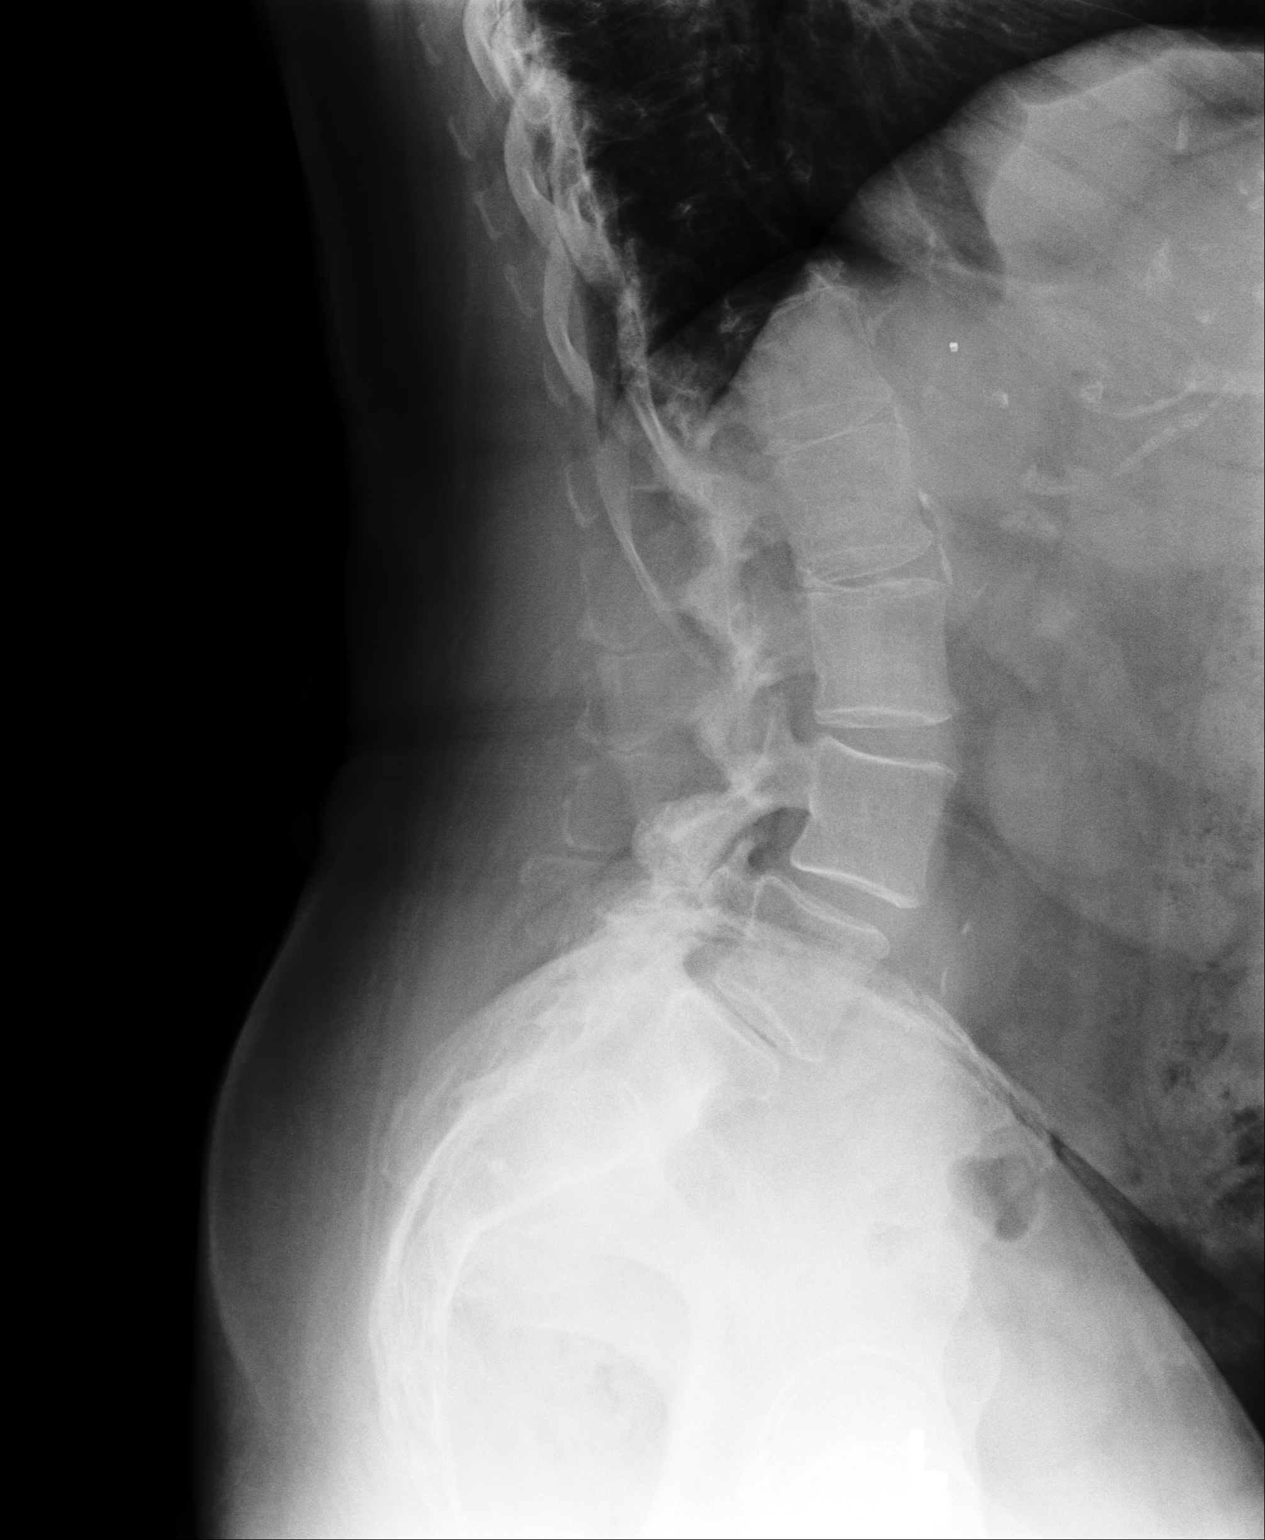
[im 3/3]
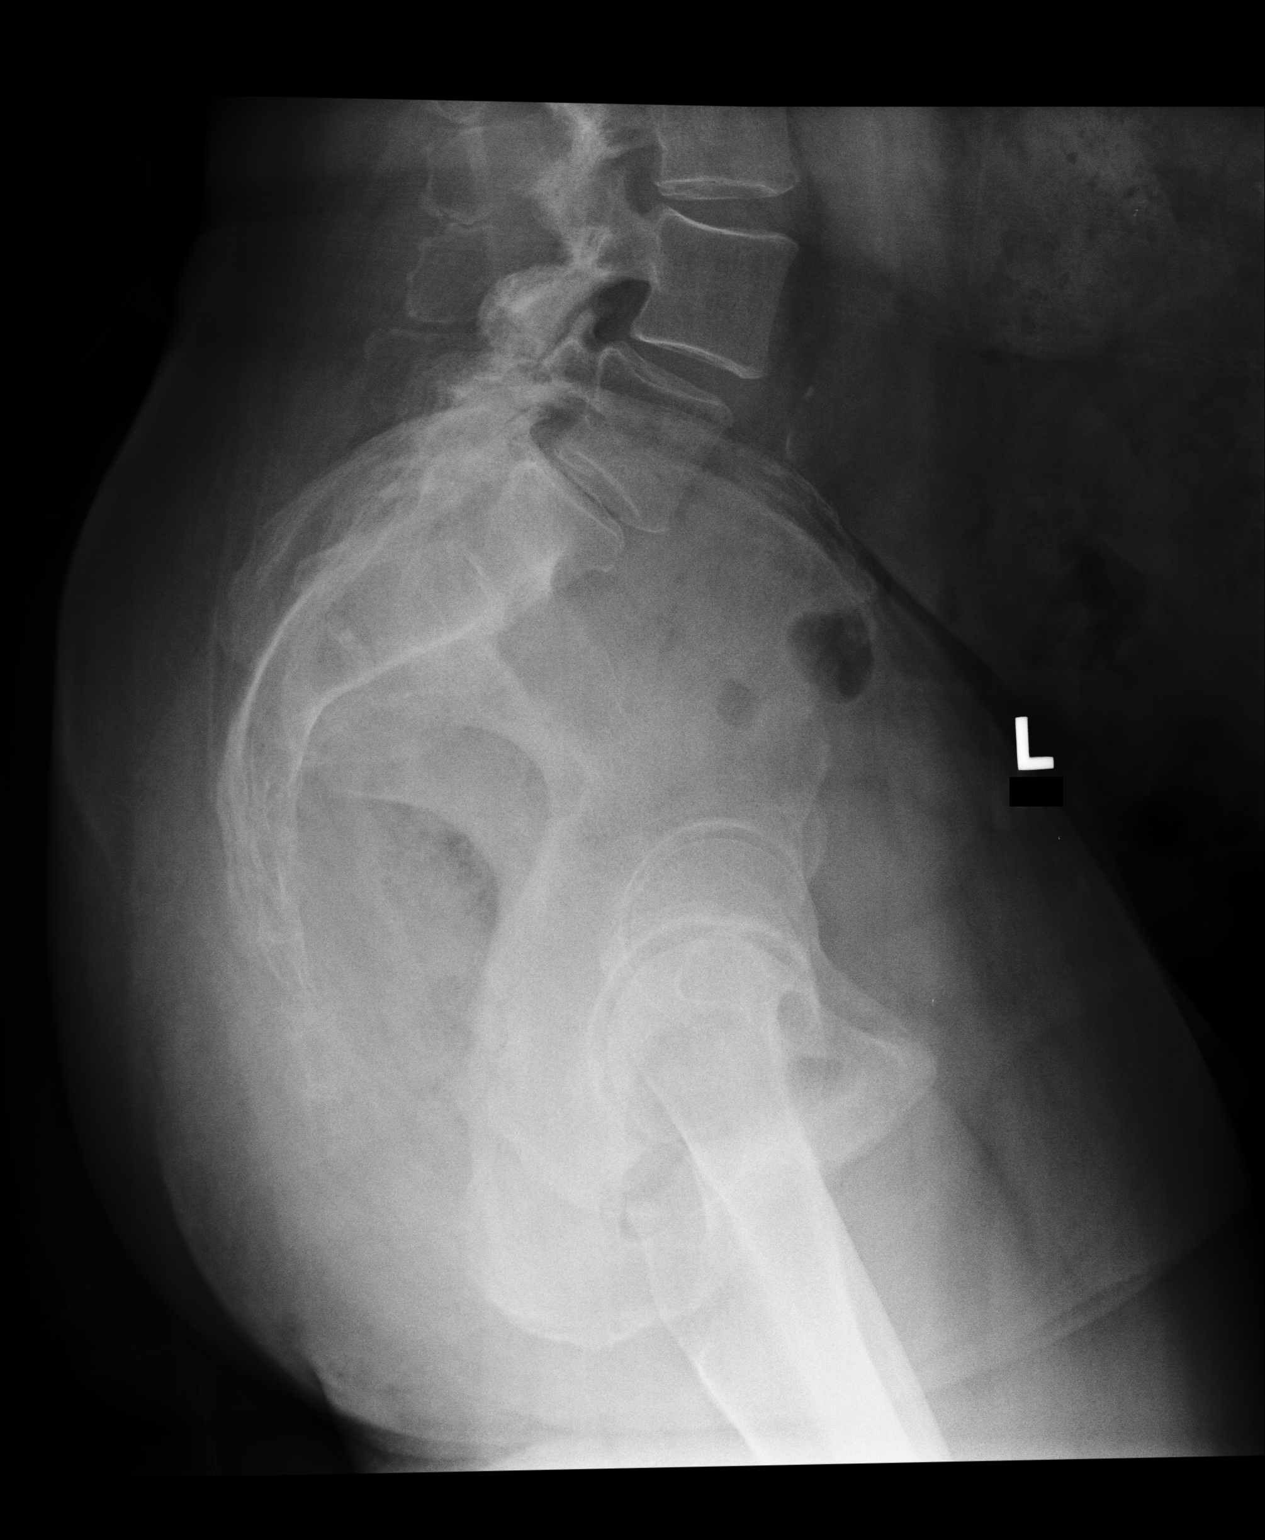

[3 of 3 positions shown; findings below may reference images not displayed]

FINDINGS: There is no acute fracture or subluxation. Moderate to severe degenerative disc disease is noted at L5-S1 level. There is grade 1 anterolisthesis of L4 on L5 and L5 on S1 vertebral body. There is also moderate facet arthropathy within the lower lumbar spine. Paraspinal soft tissues are unremarkable. There are scattered vascular calcifications.
IMPRESSION: Multilevel degenerative changes as detailed above.

## 2020-08-17 IMAGING — MG 3D SCREENING MAMMO BIL W/CAD
5 series · 8 of 24 positions shown · non-contrast
Comparison: 03/12/2021 and 03/07/2020.

------------- REPORT [id number] -------------
[facility]
[address]
We wish to report the following on your recent mammography examination. We are sending a report to your referring physician or other health care provider. 
(       Normal/Negative:
No evidence of cancer.
This statement is mandated by the [place], Department of Health.
Your examination was performed by one of our technologists, who are registered radiological technologists and also specially certified in mammography:
___
Silomea, Viliame R (M)

Your mammogram was interpreted by our radiologist.
( 
Kyuhong Jeang, M.D.
(Annual Breast Examination by a physician or other health care provider
(Annual Mammography Screening beginning at age 40
(Monthly Breast Self Examination
------------- REPORT [id number] -------------
﻿
                         #:
EXAM:  3D BILATERAL ANNUAL SCREENING DIGITAL MAMMOGRAM WITH CAD AND TOMOSYNTHESIS
INDICATION: Screening.

[R]
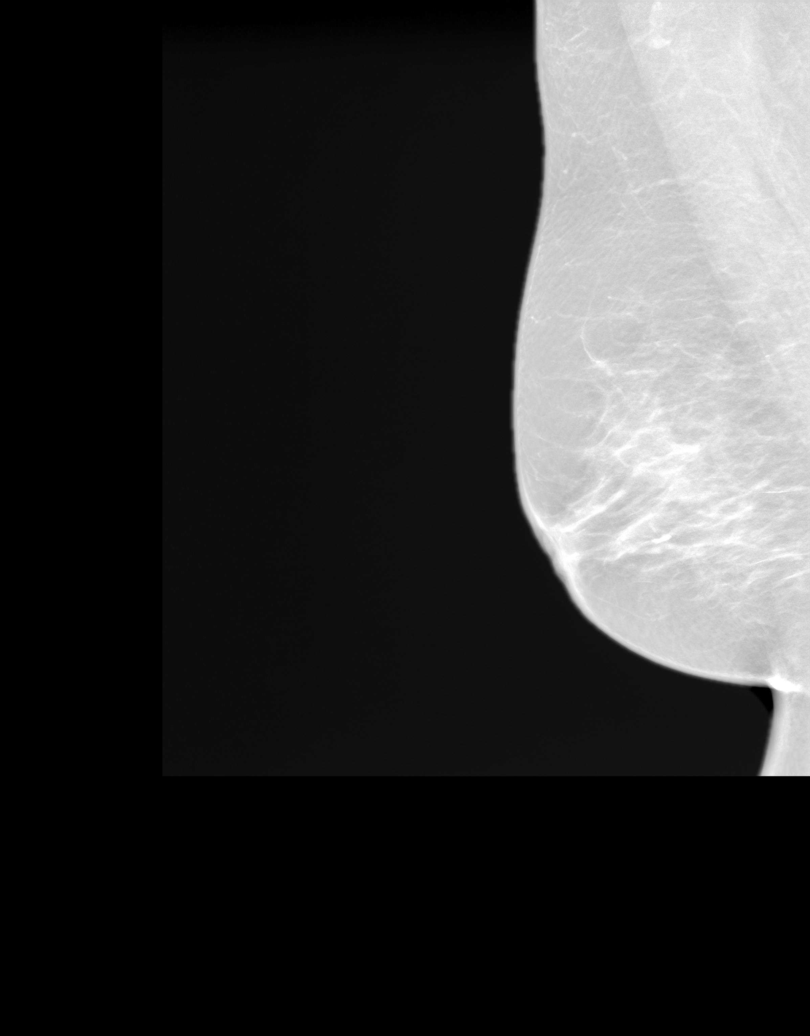

[L]
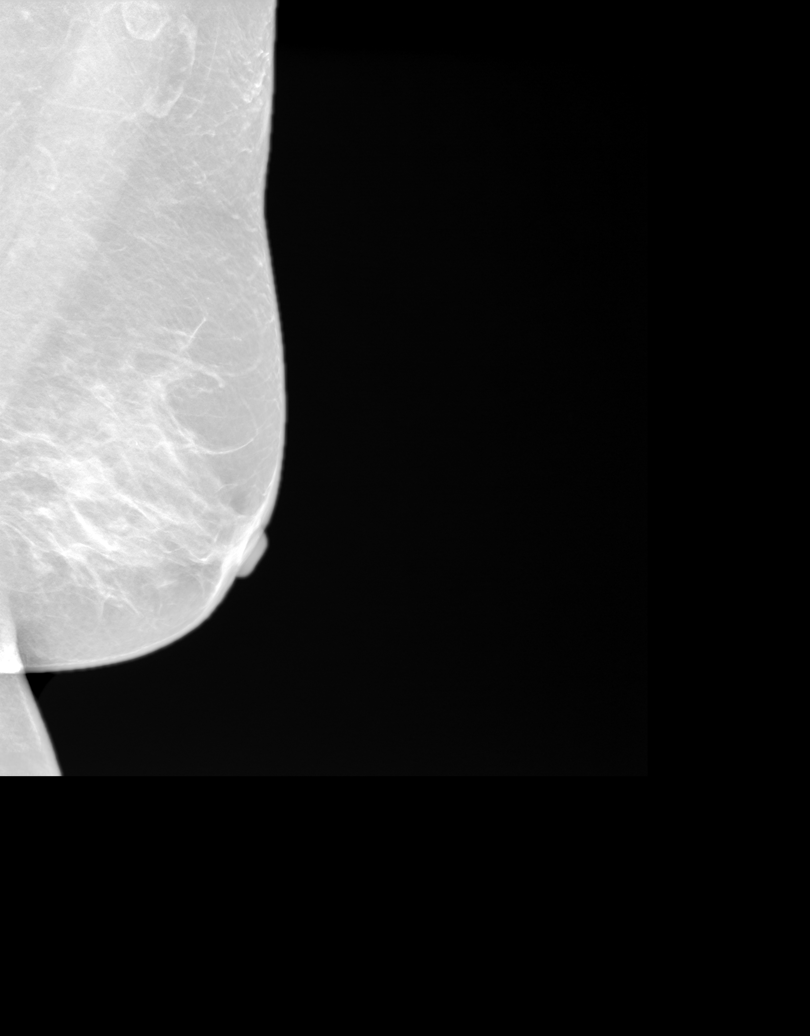

[Series 4006: R CC · right · 2 of 2 slices shown]
[im 1/2]
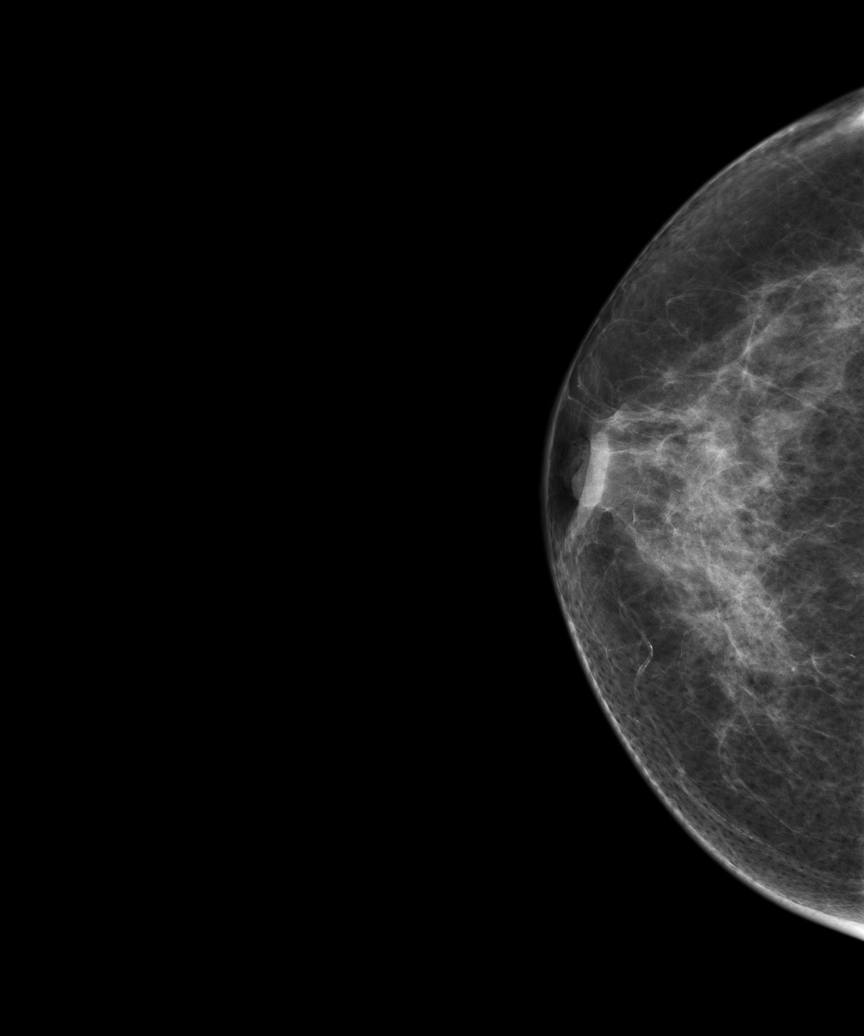
[im 2/2]
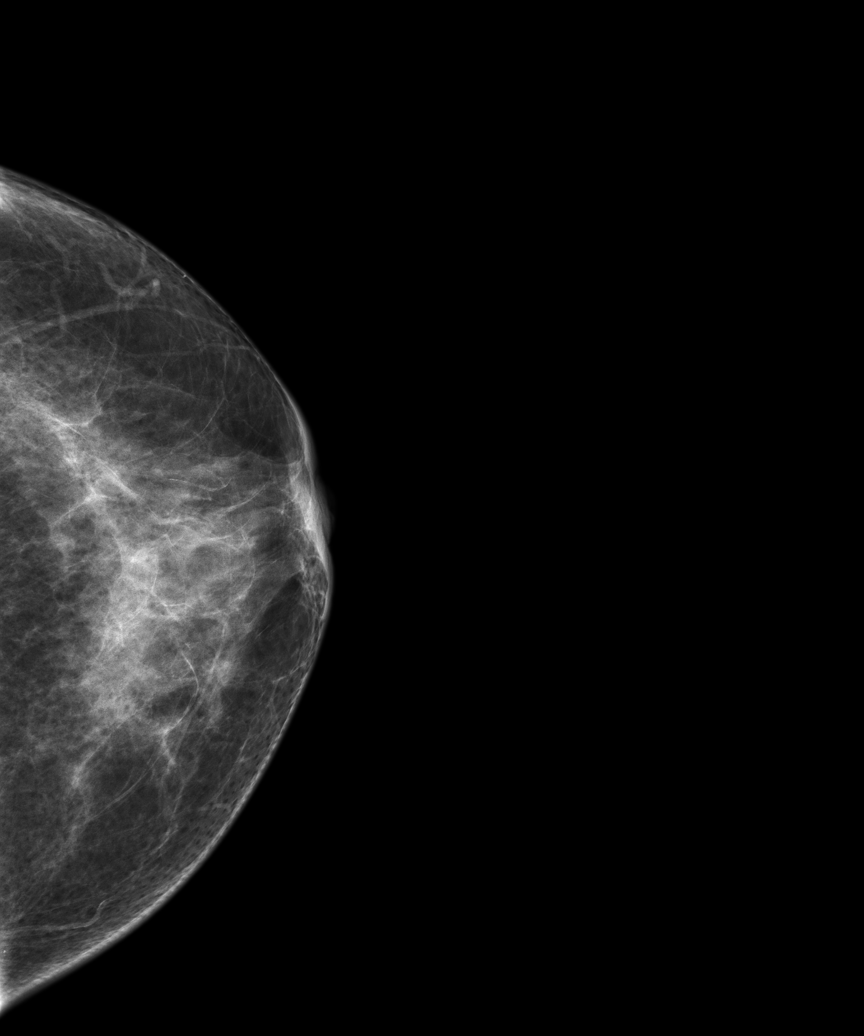

[Series 4008: 3D SCREENING MAMMO BIL W/CAD · 2 acquisitions, 3 frames shown (1 of 2)]
[im 1/2]
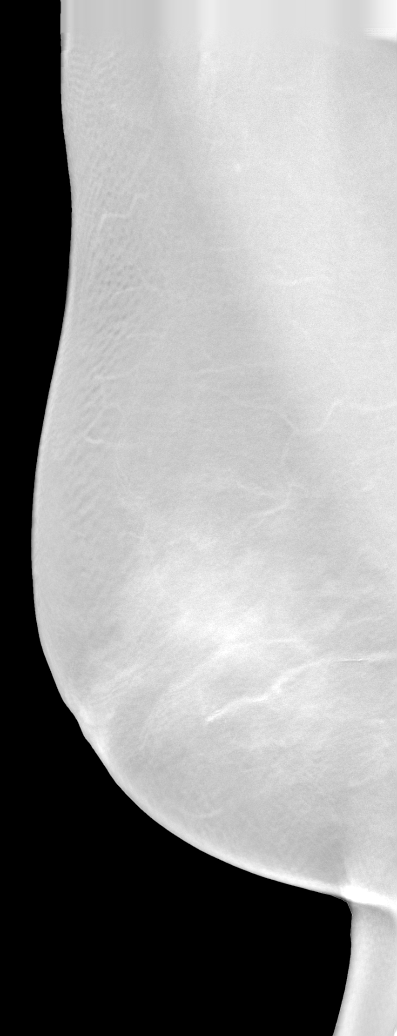
[im 2/2]
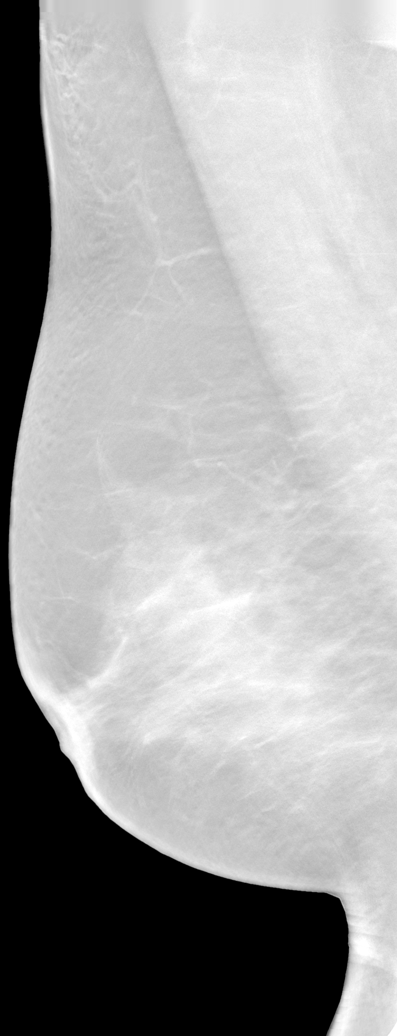
[im 2/2]
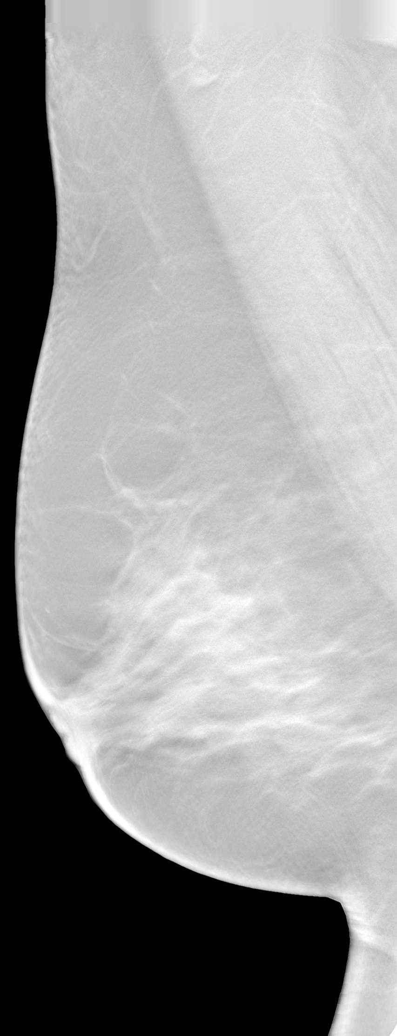

[3D SCREENING MAMMO BIL W/CAD (2 of 2) · tomo slice 11/66.0]
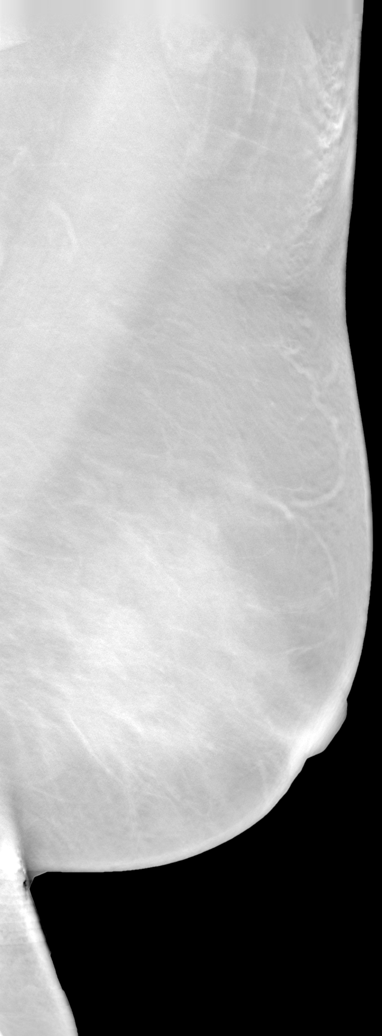

[8 of 24 positions shown; findings below may reference images not displayed]

FINDINGS: Breast parenchyma is heterogeneously dense.  There is no mass or suspicious cluster of microcalcifications.  There is no architectural distortion, skin thickening or nipple retraction.
IMPRESSION: 1.  BIRADS 2-Benign findings. Patient has been added in a reminder system with a target date for the next screening mammography.

2.  DENSITY CODE – C (Heterogeneously dense).  

Final Assessment Code:

Bi-Rads 2 

BI-RADS 0
Need additional imaging evaluation.

BI-RADS 1
Negative mammogram.

BI-RADS 2
Benign finding.

BI-RADS 3
Probably benign finding; short-interval follow-up suggested.

BI-RADS 4
Suspicious abnormality; biopsy should be considered.

BI-RADS 5
Highly suggestive of malignancy; appropriate action should be taken.

BI-RADS 6
Known biopsy-proven malignancy; appropriate action should be taken.

NOTE:
In compliance with Federal regulations, the results of this mammogram are being sent to the patient.

## 2021-05-12 IMAGING — MR MRI LUMBAR SPINE WITHOUT CONTRAST
4 of 6 series · 31 of 48 positions shown · IV contrast (gadolinium)
Comparison: Radiographs dated 10/15/2019.

﻿EXAM:  [id number]   MRI LUMBAR SPINE WITHOUT CONTRAST
INDICATION: Chronic lower back pain.
TECHNIQUE: Multiplanar multisequential MRI of the lumbar spine was performed without gadolinium contrast.

[Series 8: T2 · sagittal · 4.0mm · 1.01mm/px · 6 of 13 slices shown (1 of 3)]
[im 1/13]
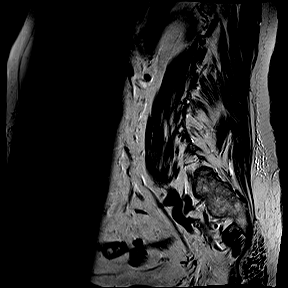
[im 3/13]
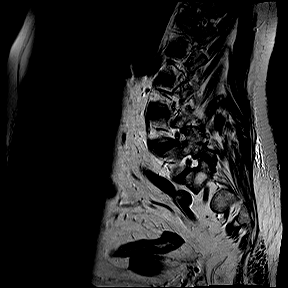
[im 5/13]
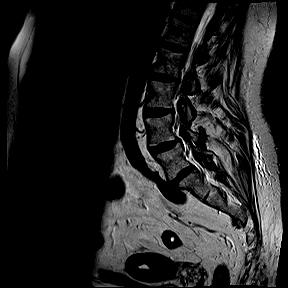
[im 8/13]
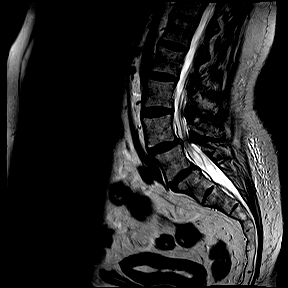
[im 10/13]
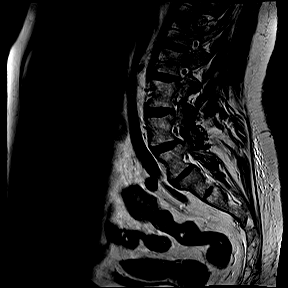
[im 13/13]
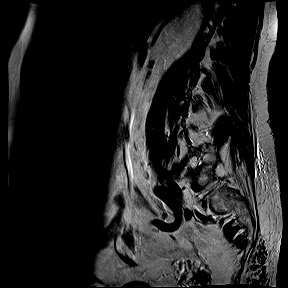

[Series 9: T1 · sagittal · 4.0mm · 1.01mm/px · 6 of 13 slices shown]
[im 1/13]
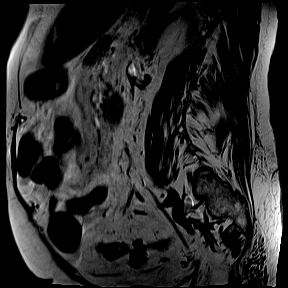
[im 3/13]
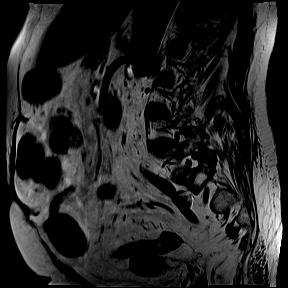
[im 5/13]
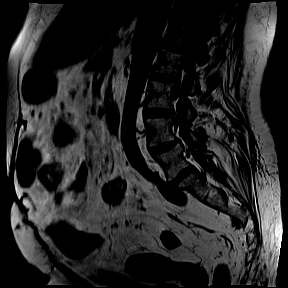
[im 8/13]
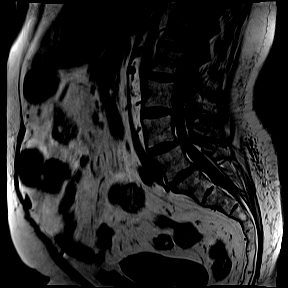
[im 10/13]
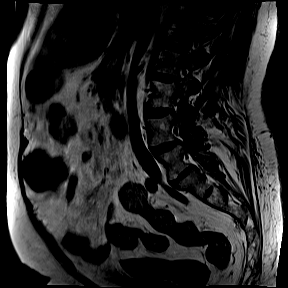
[im 13/13]
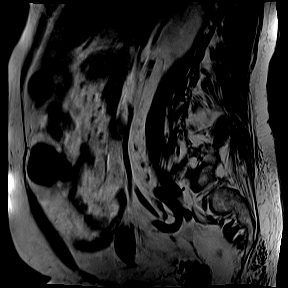

[Series 11: T2 · oblique · 4.0mm · 0.52mm/px · 11 of 23 slices shown (2 of 3)]
[im 1/23]
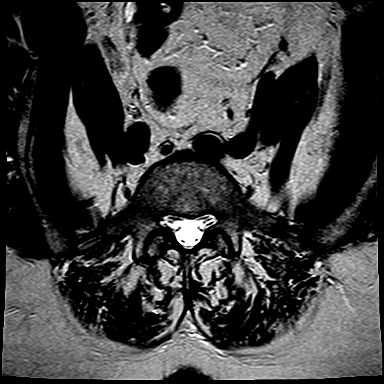
[im 3/23]
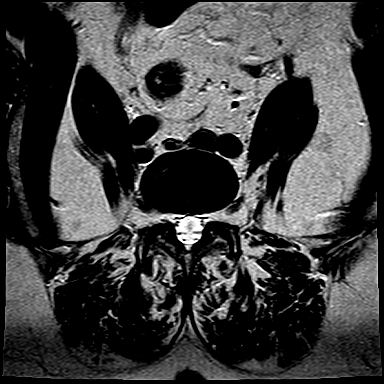
[im 5/23]
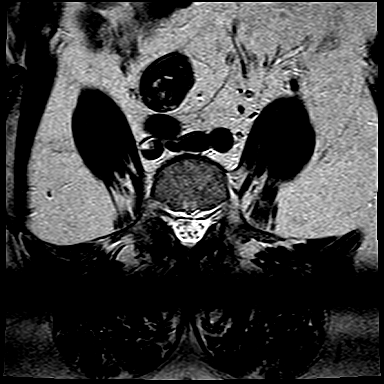
[im 7/23]
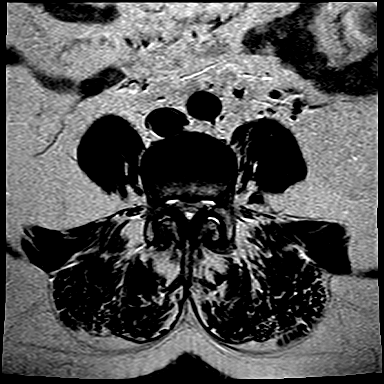
[im 9/23]
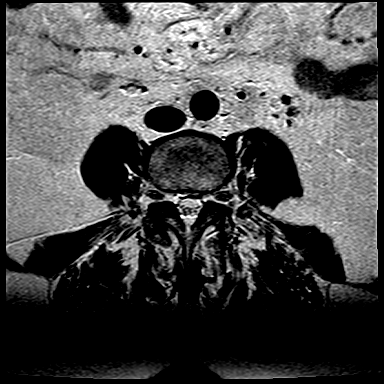
[im 12/23]
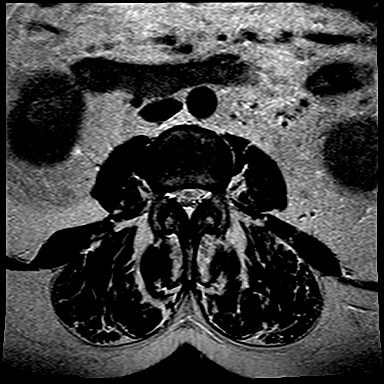
[im 14/23]
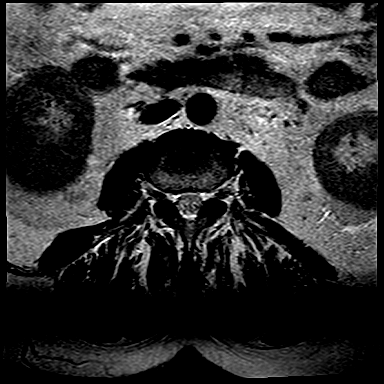
[im 16/23]
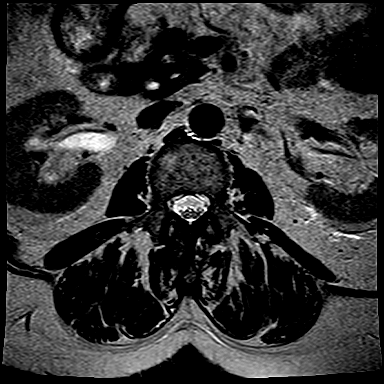
[im 18/23]
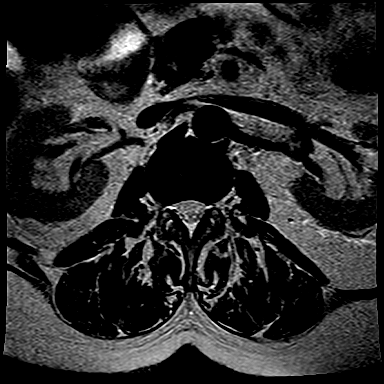
[im 20/23]
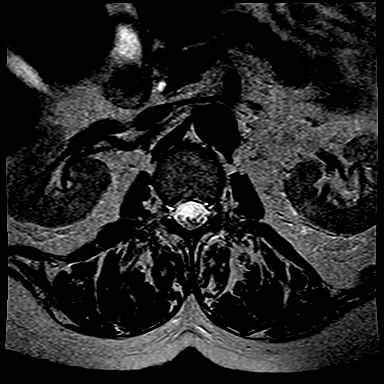
[im 23/23]
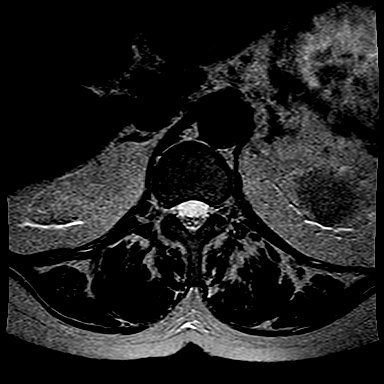

[Series 13: T2 · coronal · 5.0mm · 0.82mm/px · 8 of 18 slices shown (3 of 3)]
[im 1/18]
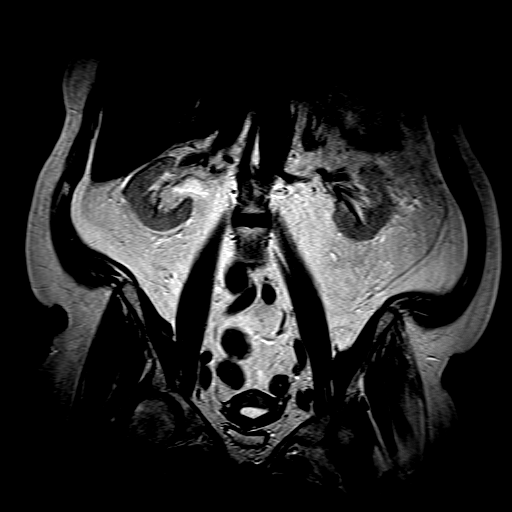
[im 3/18]
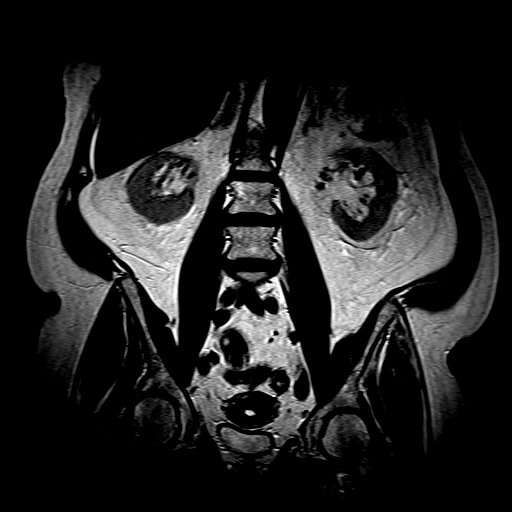
[im 5/18]
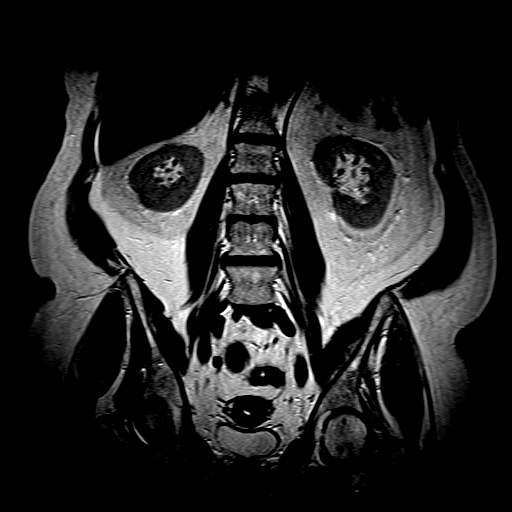
[im 8/18]
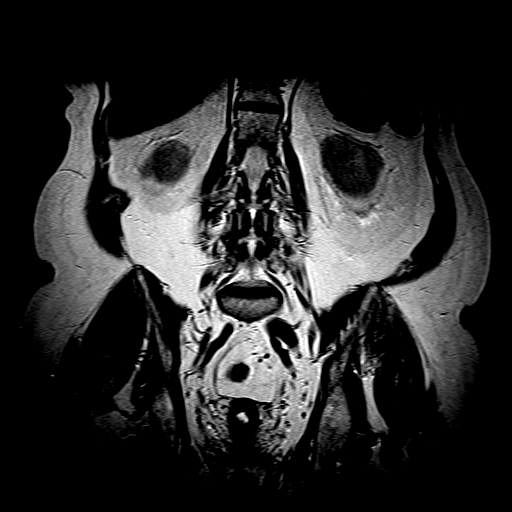
[im 10/18]
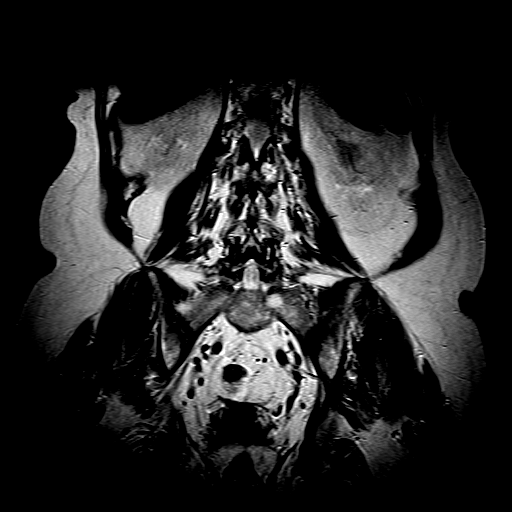
[im 13/18]
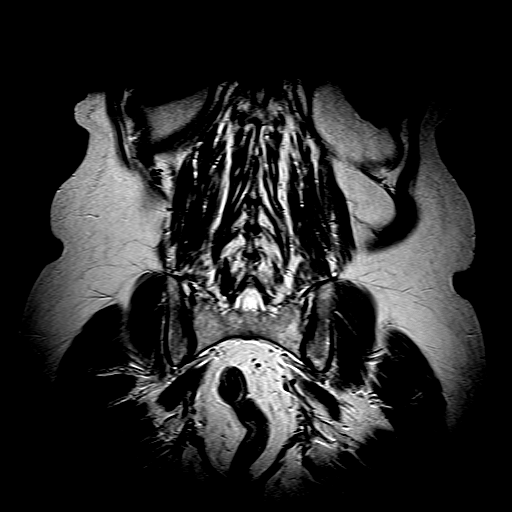
[im 15/18]
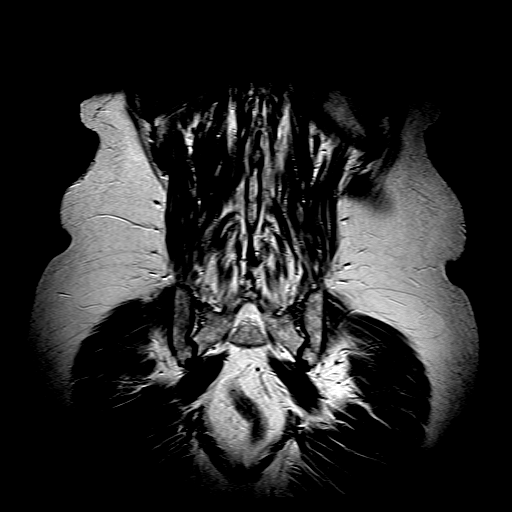
[im 18/18]
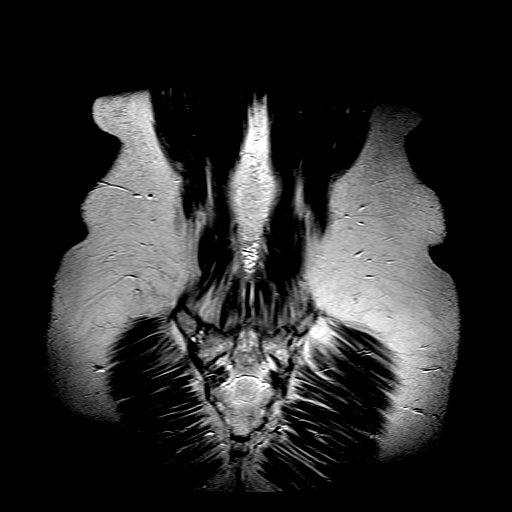

[31 of 48 positions shown; findings below may reference images not displayed]

FINDINGS: Bone marrow signal intensity is normal. There is no acute fracture or subluxation. Distal spinal cord is normal in signal intensity and terminates normally at T12-L1 disc space level. Spinal canal is congenitally narrow.

L1-2 level is unremarkable.

At L2-3 level, there is a minimal bulging annulus, minimally effacing the ventral thecal sac. There is no significant neural foraminal stenosis.

At L3-4 level, there is a minimal bulging annulus, minimally effacing the ventral thecal sac. There is mild left and moderate right neural foraminal stenosis from facet arthropathy and bulging annulus.

At L4-5 level, there is grade 1 anterolisthesis of L4 on L5 vertebral body. There is a small broad-based central disc bulge resulting in severe spinal stenosis. There is moderate bilateral neural foraminal stenosis from facet arthropathy and bulging annulus.

At L5-S1 level, there is grade 1 anterolisthesis of L5 on S1 vertebral body. There is no significant disc herniation. There is mild left neural foraminal stenosis from facet arthropathy.

Paraspinal soft tissues are unremarkable. There is abnormal thickening of the endometrium measuring 10 mm.
IMPRESSION: 1. Grade 1 anterolisthesis of L4 on L5 and L5 on S1 vertebral body. 

2. Severe spinal stenosis at L4-5 level from a small central disc bulge. 

3. Multilevel neural foraminal stenosis as detailed above. 

4. Abnormal thickening of the endometrium measuring 10 mm. Dedicated pelvic sonogram is recommended for better assessment.

## 2021-09-23 IMAGING — US DUPLEX VENOUS US UNI LEFT
1 series · 14 of 24 positions shown · non-contrast
Comparison: None available.

﻿EXAM:  DUPLEX VENOUS US UNI LEFT
INDICATION: Left leg pain and swelling.

[Series 1: duplex venous us unilateral · portal-venous · 14 of 64 slices shown]
[im 1/64]
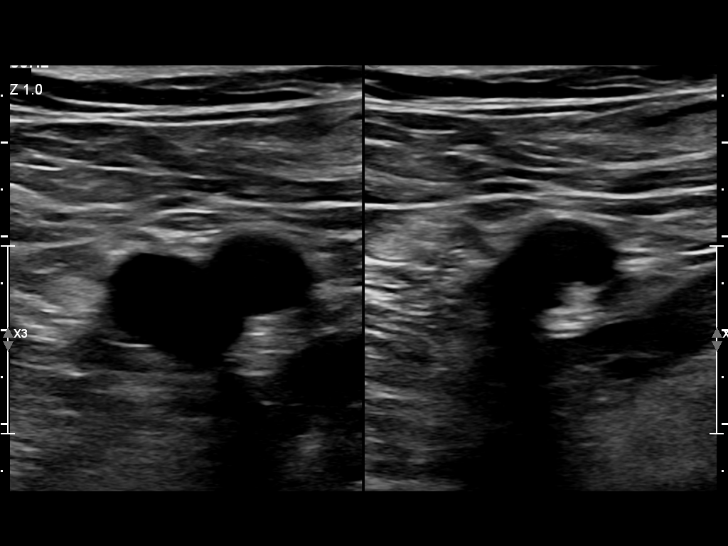
[im 6/64]
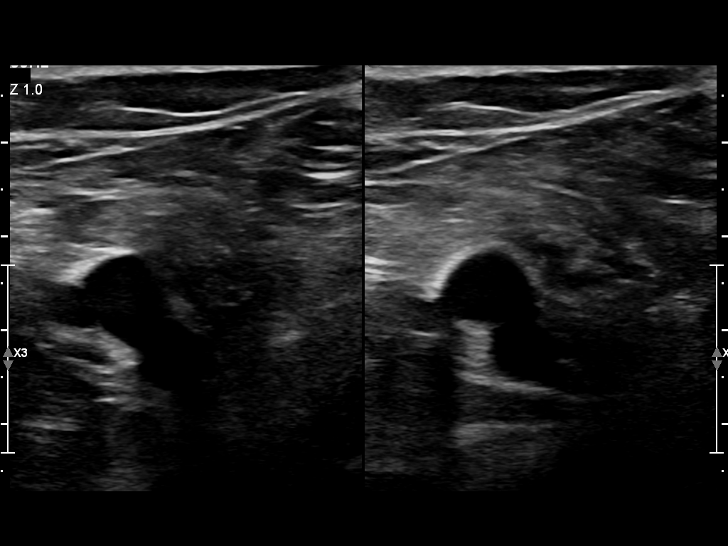
[im 11/64]
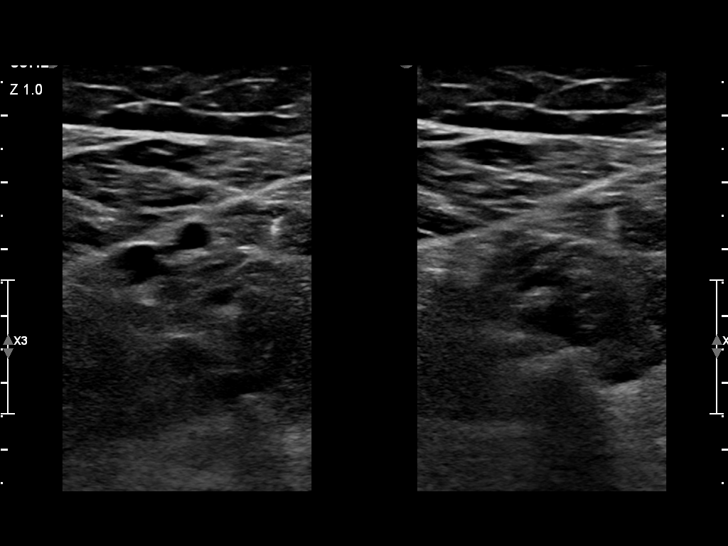
[im 17/64]
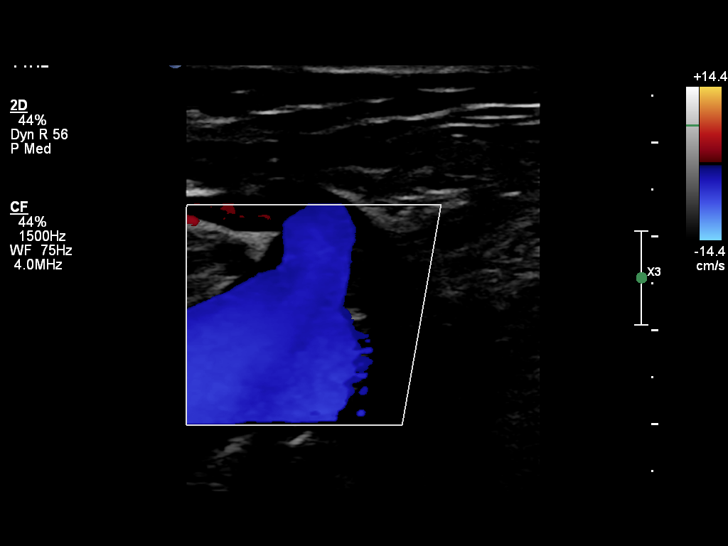
[im 20/64]
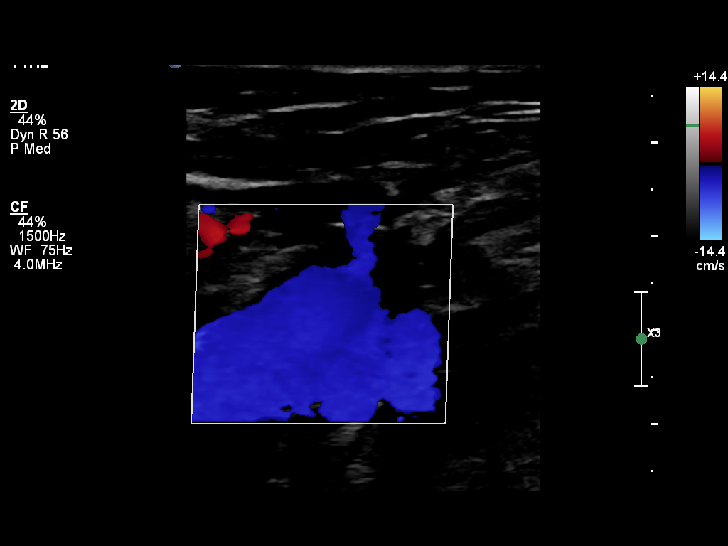
[im 25/64]
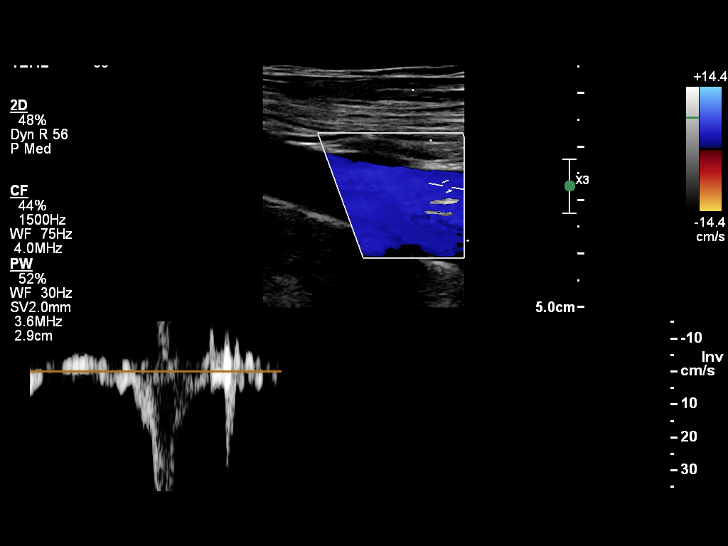
[im 31/64]
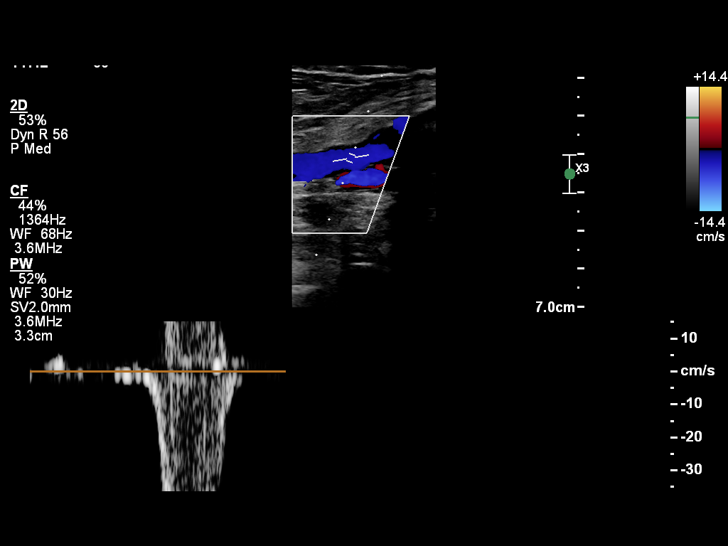
[im 33/64]
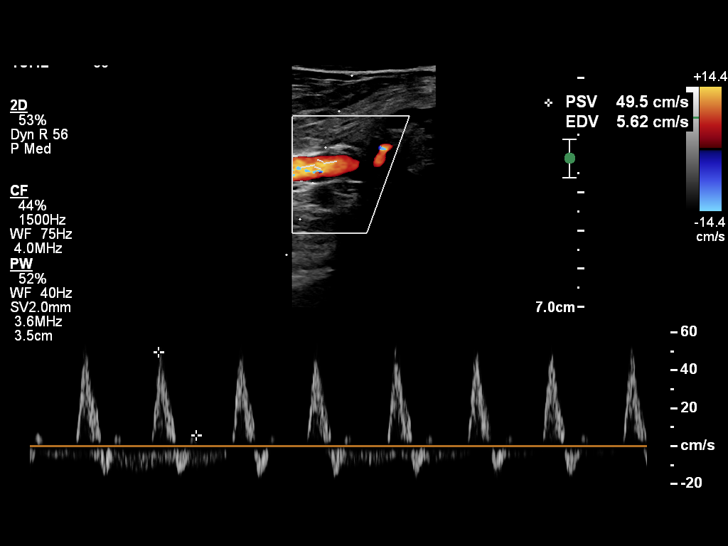
[im 39/64]
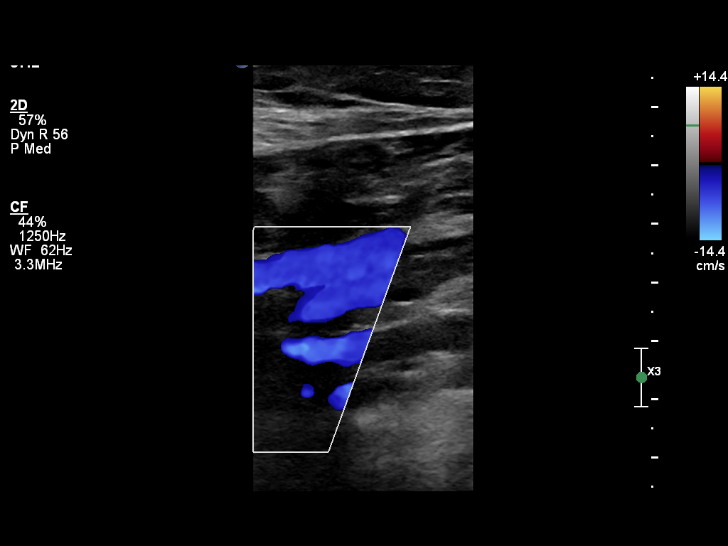
[im 44/64]
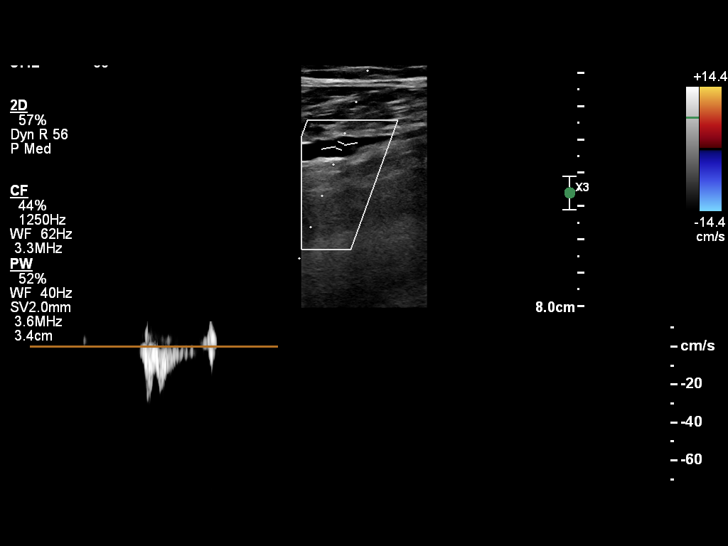
[im 50/64]
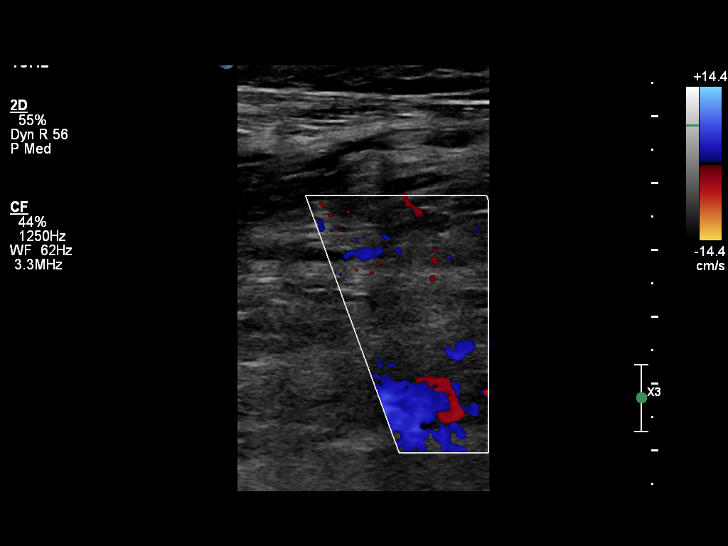
[im 53/64]
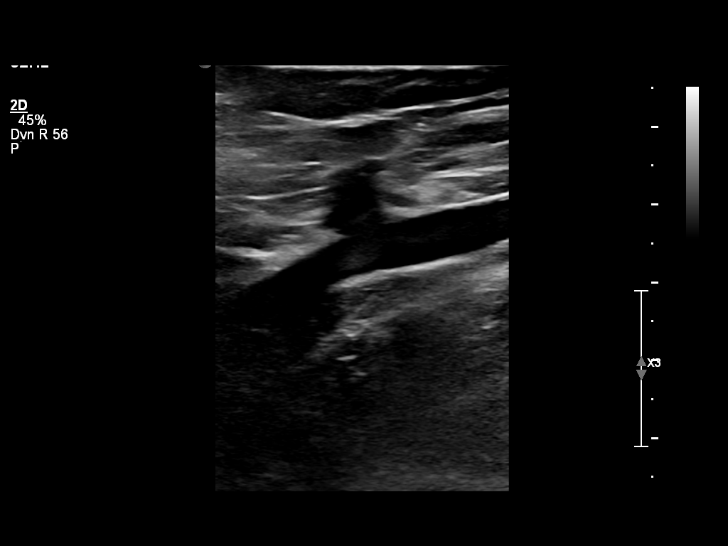
[im 58/64]
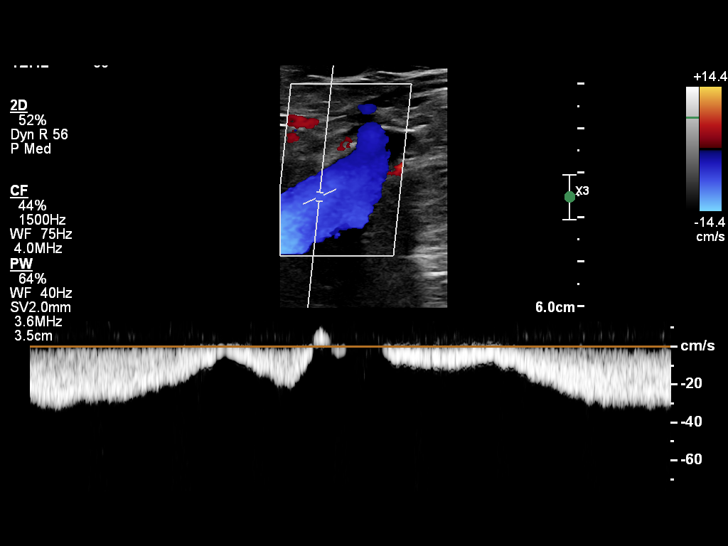
[im 64/64]
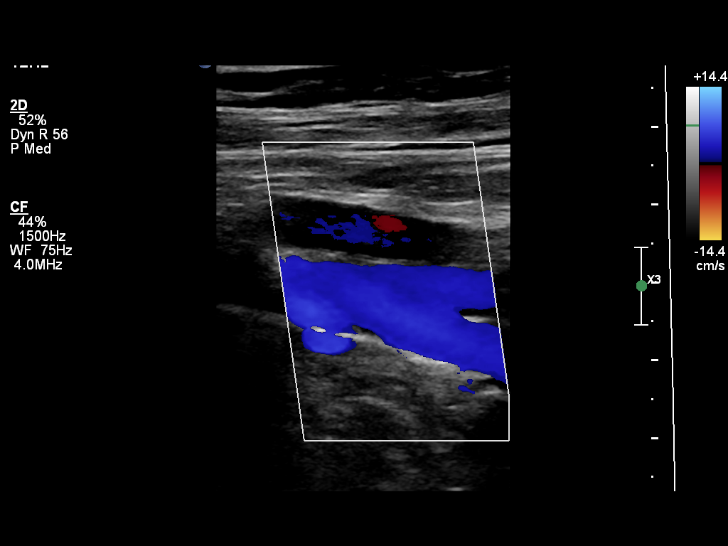

[14 of 24 positions shown; findings below may reference images not displayed]

FINDINGS: Sonographic evaluation of the left lower extremity was performed utilizing grayscale, color flow and pulsed Doppler techniques. 

The common femoral, superficial femoral and popliteal veins demonstrate normal compressibility, respiratory phasicity and augmentation. Spectral Doppler analysis is also unremarkable. The calf veins are also patent. The contralateral common femoral vein also demonstrates normal compressibility.
IMPRESSION: No evidence of left lower extremity deep venous thrombus.

## 2021-10-26 IMAGING — MG 3D SCREENING MAMMO BIL AND TOMO
5 series · 8 of 24 positions shown · non-contrast
Comparison: 11/10/2021

------------- REPORT [id number] -------------
﻿

GUALPA, MAYUMI
EXAM:  3D BILATERAL ANNUAL SCREENING DIGITAL MAMMOGRAM WITH TOMOSYNTHESIS
INDICATION: Screening.

[L]
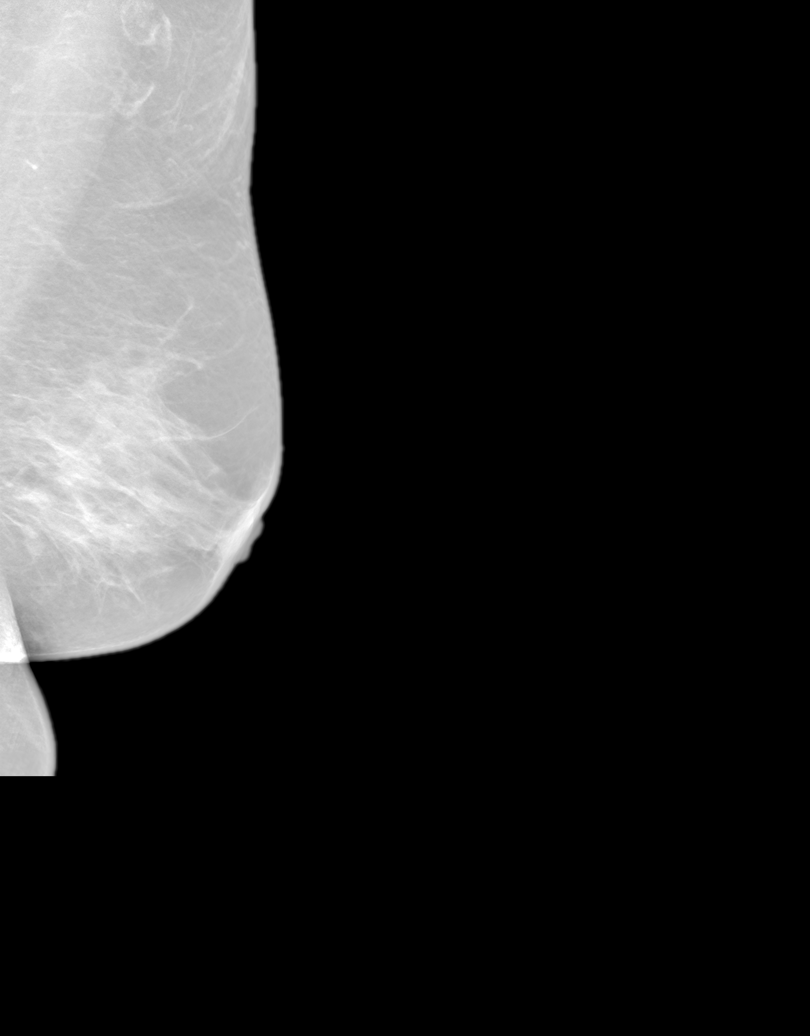

[R]
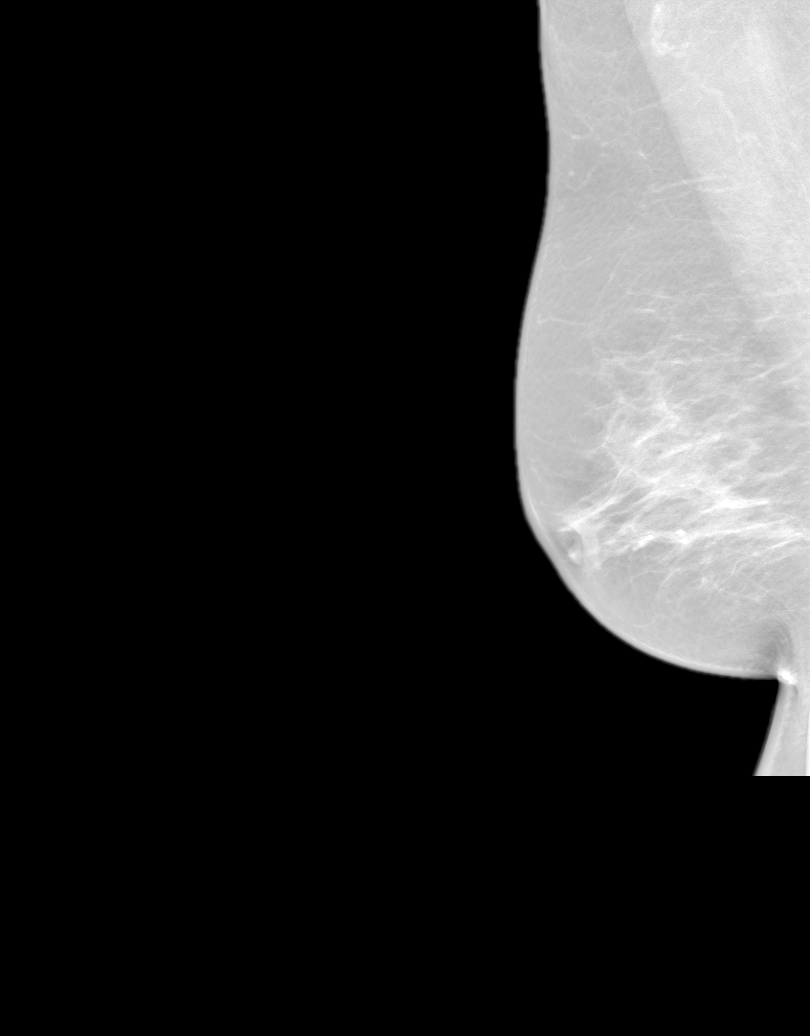

[R CC tomo · right · 0.10mm/px · 2 of 2 slices shown]
[im 1/2]
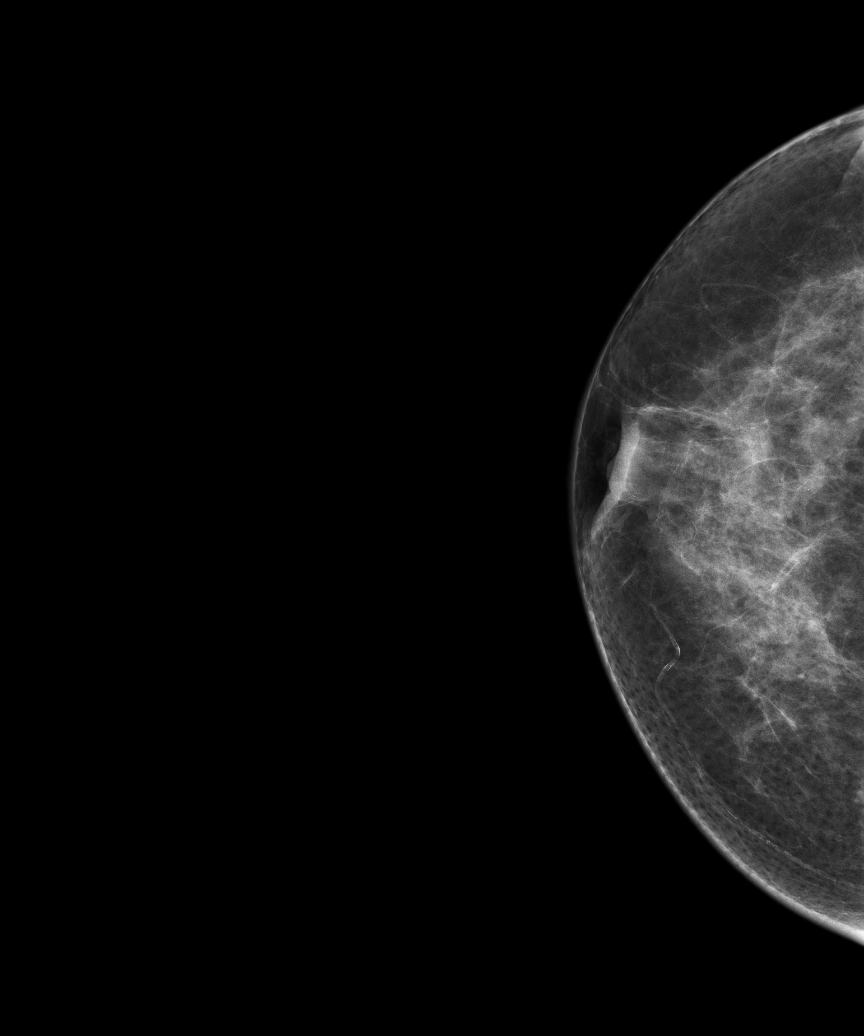
[im 2/2]
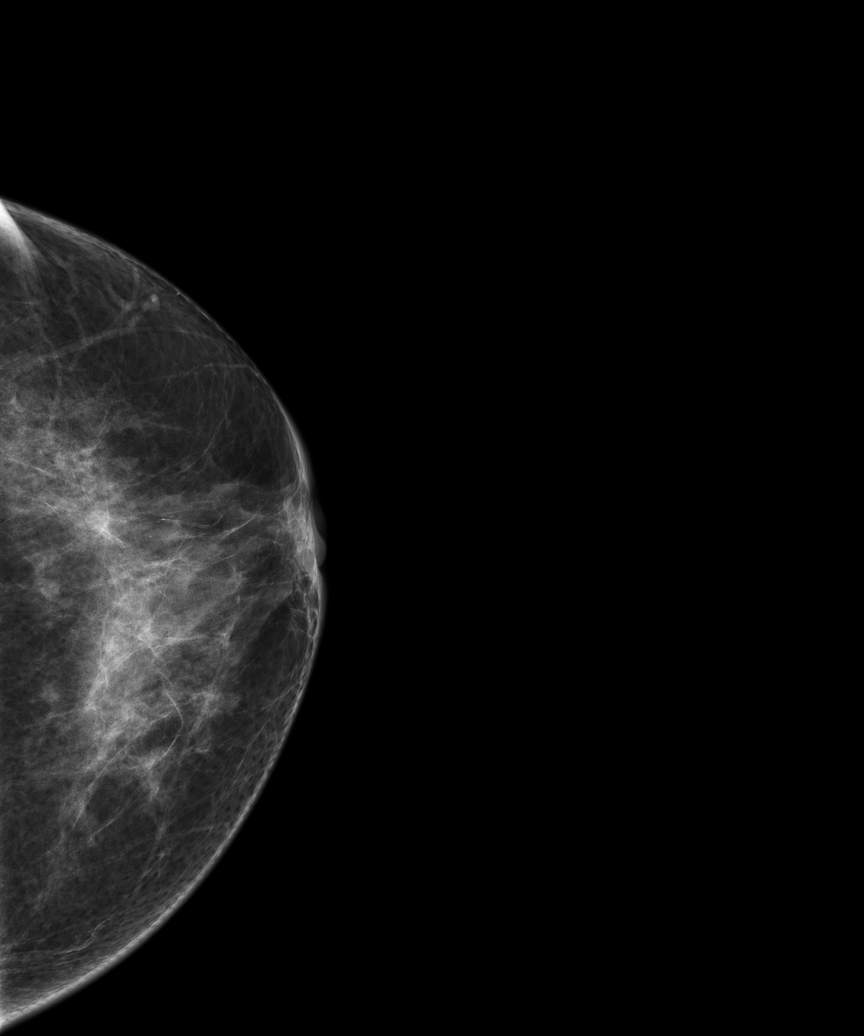

[3D SCREENING MAMMO BIL AND TOMO tomo · 2 acquisitions, 3 frames shown (1 of 2)]
[im 1/2]
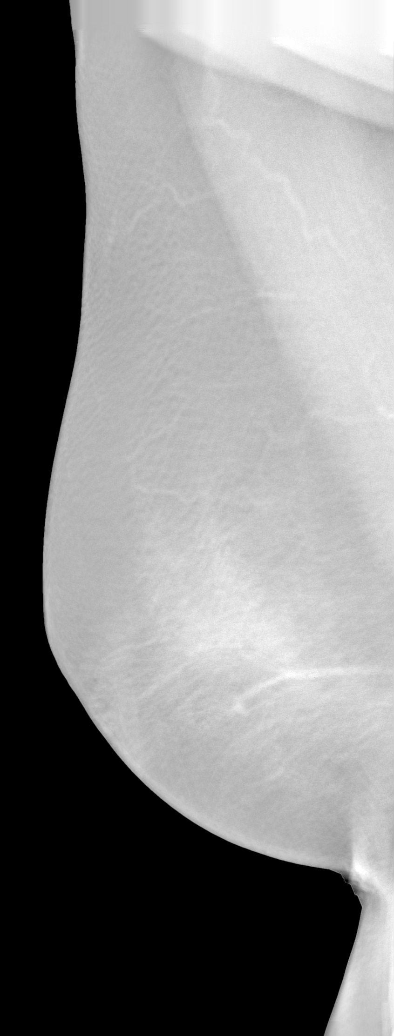
[im 2/2]
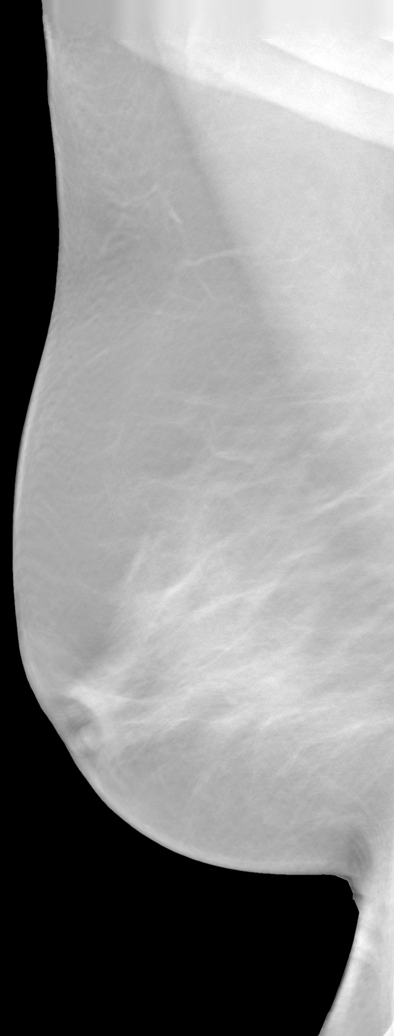
[im 2/2]
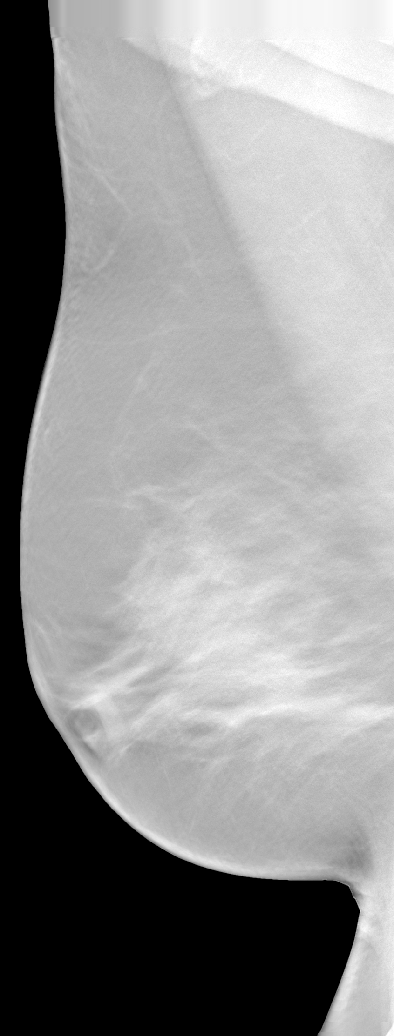

[3D SCREENING MAMMO BIL AND TOMO tomo (2 of 2) · tomo slice 12/73.0]
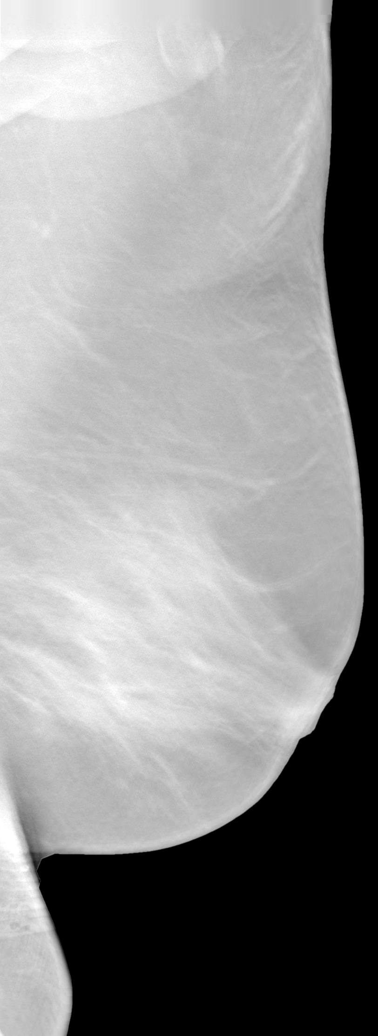

[8 of 24 positions shown; findings below may reference images not displayed]

FINDINGS: Breast parenchyma is heterogeneously dense.  There is no mass or suspicious cluster of microcalcifications.  There is no architectural distortion, skin thickening or nipple retraction.
IMPRESSION: 1.  BIRADS 2-Benign findings. Patient has been added in a reminder system with a target date for the next screening mammography.

2.  DENSITY CODE – C (Heterogeneously dense). 

Final Assessment Code:

Bi-Rads 2 

BI-RADS 0
 Need additional imaging evaluation.

BI-RADS 1
 Negative mammogram.

BI-RADS 2
 Benign finding.

BI-RADS 3
 Probably benign finding; short-interval follow-up suggested.

BI-RADS 4
 Suspicious abnormality; biopsy should be considered.

BI-RADS 5
 Highly suggestive of malignancy; appropriate action should be taken.

BI-RADS 6
 Known biopsy-proven malignancy; appropriate action should be taken.

NOTE:
In compliance with Federal regulations, the results of this mammogram are being sent to the patient.

------------- REPORT [id number] -------------
[facility] [place]
[address]
We wish to report the following on your recent mammography examination. We are sending a report to your referring physician or other health care provider. 
(       Normal/Negative:
No evidence of cancer.
This statement is mandated by the [place], Department of Health.
Your examination was performed by one of our technologists, who are registered radiological technologists and also specially certified in mammography:
___
Silomea, Viliame R (M)

Your mammogram was interpreted by our radiologist.

( 
Kyuhong Jeang, M.D.

(Annual Breast Examination by a physician or other health care provider
(Annual Mammography Screening beginning at age 40
(Monthly Breast Self Examination

## 2021-11-08 ENCOUNTER — Ambulatory Visit
Admission: RE | Admit: 2021-11-08 | Discharge: 2021-11-08 | Disposition: A | Discharge status: Home / Self Care | Payer: MEDICARE | Source: Ambulatory Visit

## 2021-11-08 ENCOUNTER — Other Ambulatory Visit (HOSPITAL_COMMUNITY): Payer: Self-pay

## 2021-11-08 ENCOUNTER — Other Ambulatory Visit: Payer: Self-pay

## 2021-11-08 DIAGNOSIS — M961 Postlaminectomy syndrome, not elsewhere classified: Secondary | ICD-10-CM | POA: Insufficient documentation

## 2022-01-11 IMAGING — MR MRI CERVICAL SPINE WITHOUT CONTRAST
4 of 5 series · 23 of 48 positions shown · IV contrast (gadolinium)
Comparison: None available.

﻿EXAM:  [id number]   MRI CERVICAL SPINE WITHOUT CONTRAST
INDICATION: Neck and left shoulder pain.
TECHNIQUE: Multiplanar multisequential MRI of the cervical spine was performed without gadolinium contrast.

[Series 5: T2 · sagittal · 3.0mm · 0.62mm/px · 8 of 15 slices shown (1 of 2)]
[im 1/15]
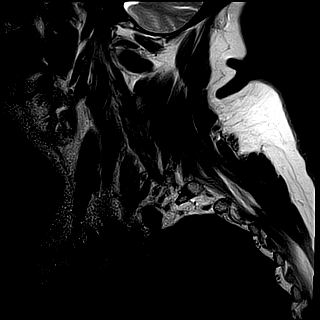
[im 3/15]
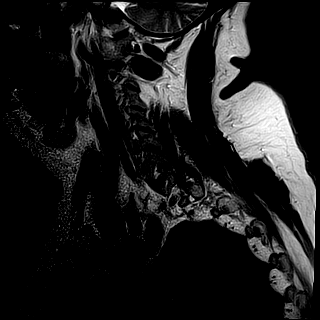
[im 5/15]
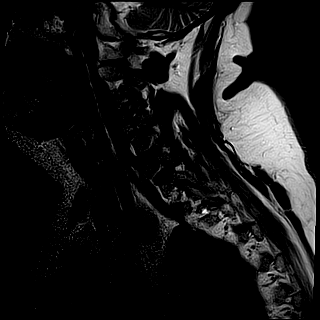
[im 7/15]
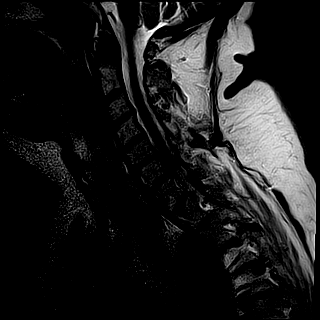
[im 9/15]
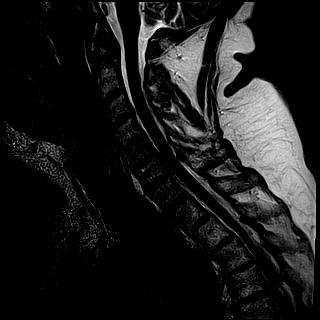
[im 11/15]
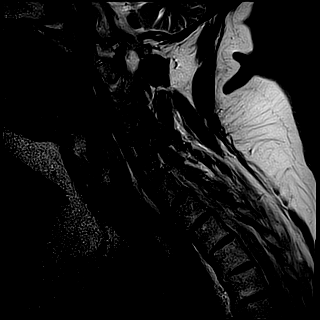
[im 13/15]
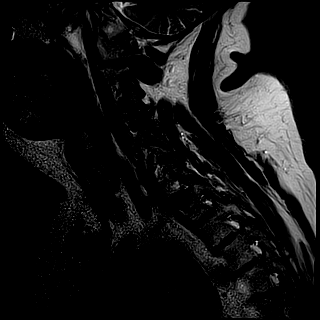
[im 15/15]
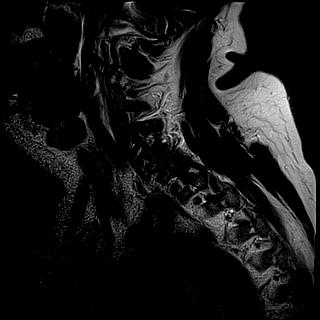

[Series 6: T1 · sagittal · 3.0mm · 0.39mm/px · 3 of 15 slices shown]
[im 2/15]
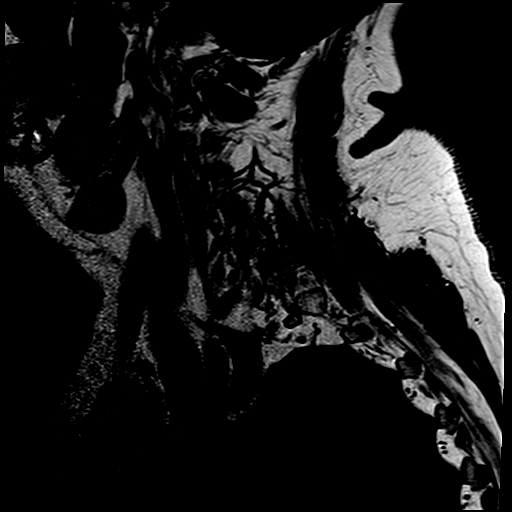
[im 8/15]
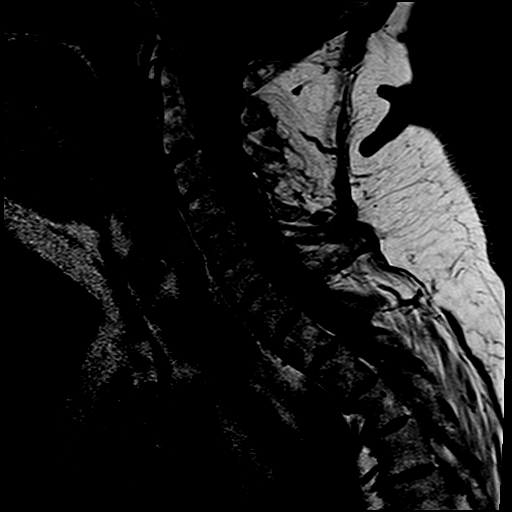
[im 13/15]
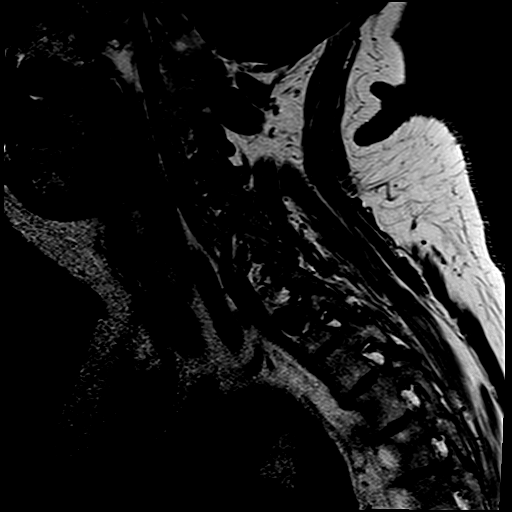

[Series 7: STIR · sagittal · 3.0mm · 0.39mm/px · 3 of 15 slices shown]
[im 2/15]
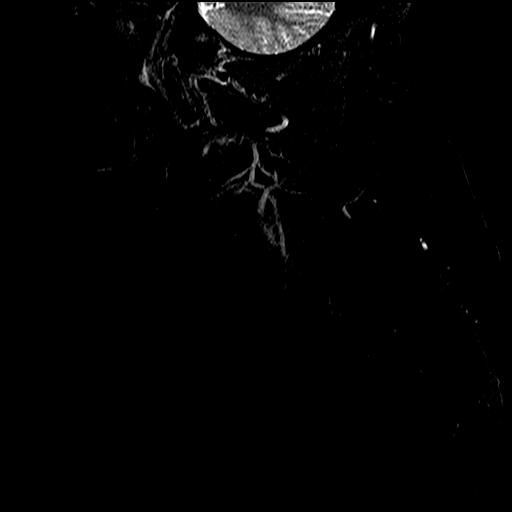
[im 8/15]
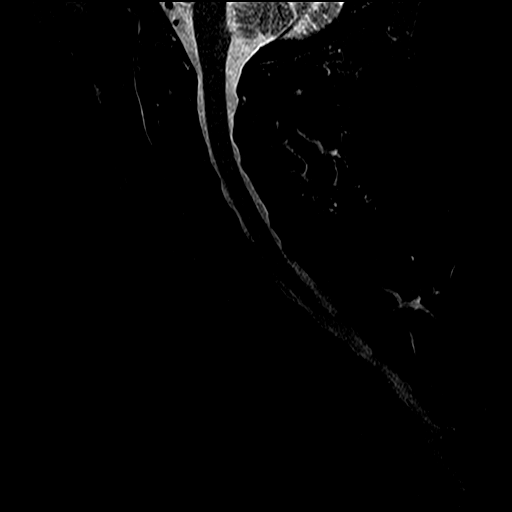
[im 13/15]
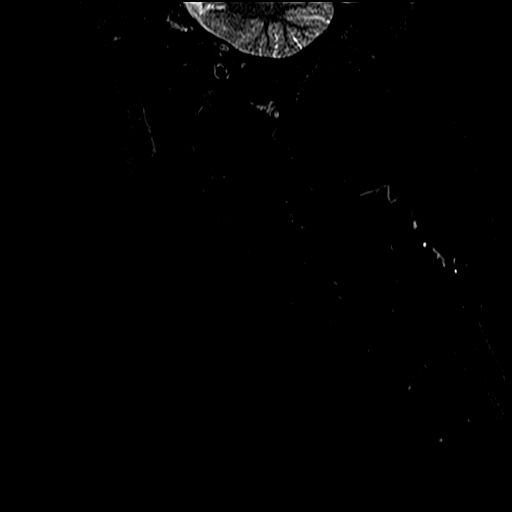

[Series 8: T2 · oblique · 3.0mm · 0.39mm/px · 9 of 18 slices shown (2 of 2)]
[im 1/18]
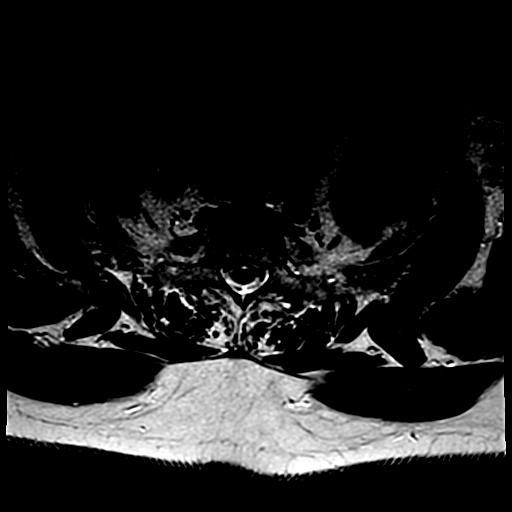
[im 2/18]
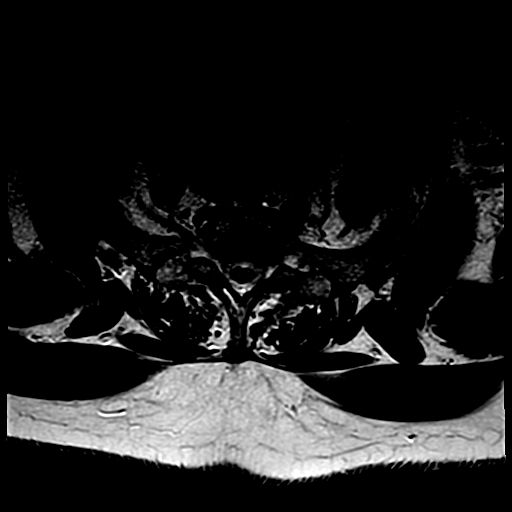
[im 4/18]
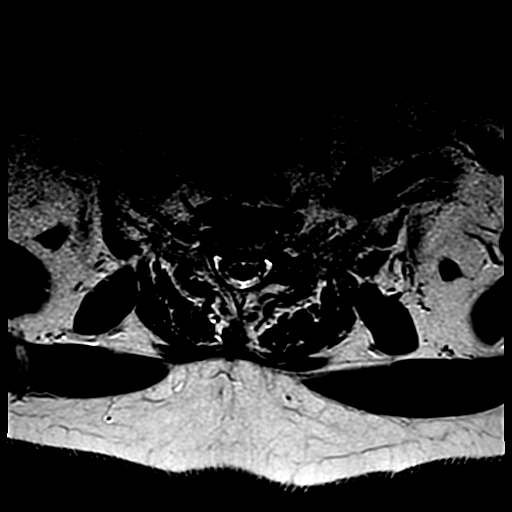
[im 6/18]
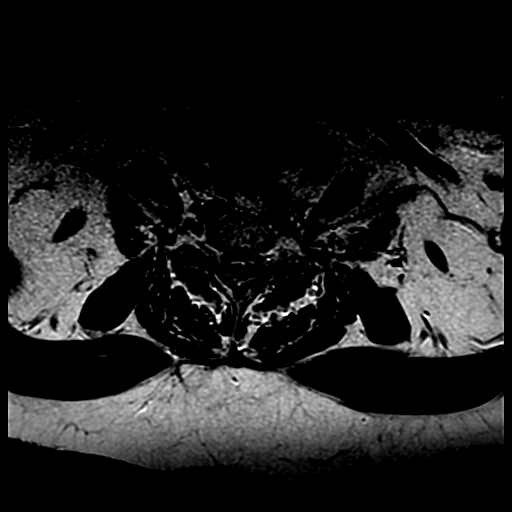
[im 7/18]
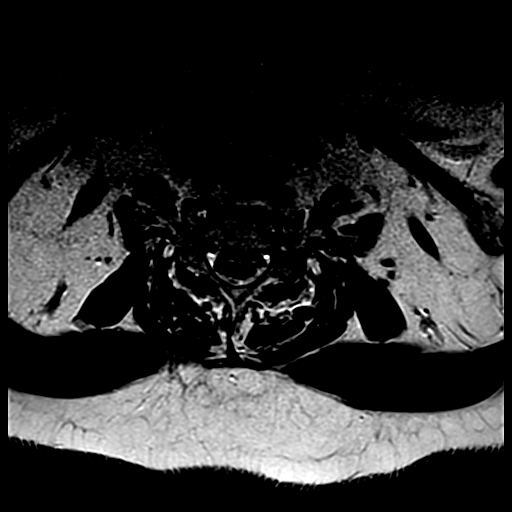
[im 9/18]
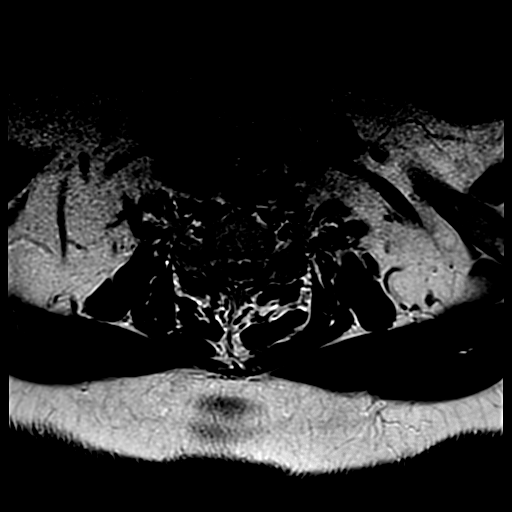
[im 11/18]
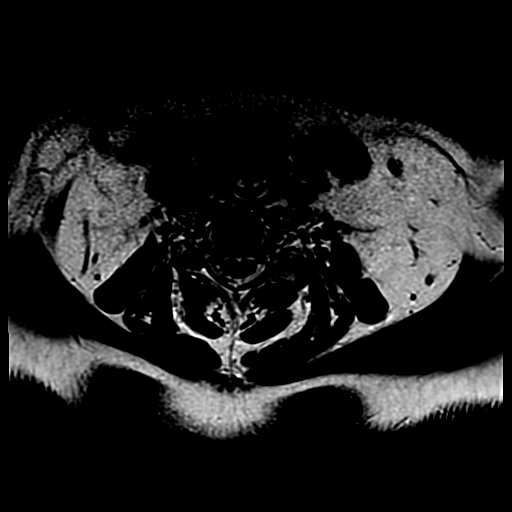
[im 12/18]
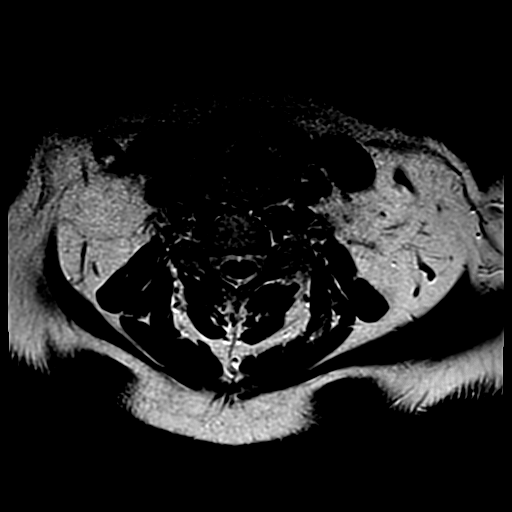
[im 16/18]
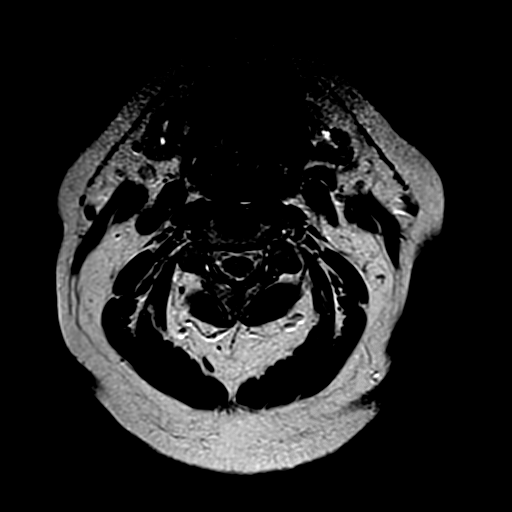

[23 of 48 positions shown; findings below may reference images not displayed]

FINDINGS: Vertebral bodies are normal in height, alignment and signal intensity. There is no acute fracture or subluxation. Visualized spinal cord is normal in signal intensity without evidence of compression at any level.

C2-3 level is unremarkable. 

At C3-4 level, there is a small broad-based central disc bulge mildly effacing the ventral CSF.  There is moderate to severe left neural foraminal stenosis from facet and uncovertebral joint hypertrophy. 

At C4-5 level, there is mild right neural foraminal stenosis from facet and uncovertebral joint hypertrophy. 

At C5-6 level, there is a small broad-based central disc osteophyte complex with near complete effacement of the ventral CSF.  There is moderate to severe bilateral neural foraminal stenosis from facet and uncovertebral joint hypertrophy. 

At C6-7 level, there is a minimal bulging annulus, minimally effacing the ventral CSF.  There is no significant neural foraminal stenosis.

C7-T1 level and paraspinal soft tissues are unremarkable.
IMPRESSION: 1. Small central disc osteophyte complex at C5-6 level with near complete effacement of the ventral CSF. 

2. Multilevel neural foraminal stenosis as detailed above.

## 2022-06-22 ENCOUNTER — Other Ambulatory Visit (HOSPITAL_COMMUNITY): Payer: Self-pay | Admitting: Family

## 2022-06-22 DIAGNOSIS — R7989 Other specified abnormal findings of blood chemistry: Secondary | ICD-10-CM

## 2022-07-14 ENCOUNTER — Other Ambulatory Visit (HOSPITAL_COMMUNITY): Payer: Self-pay | Admitting: FAMILY PRACTICE

## 2022-07-14 ENCOUNTER — Other Ambulatory Visit: Payer: Self-pay

## 2022-07-14 ENCOUNTER — Ambulatory Visit
Admission: RE | Admit: 2022-07-14 | Discharge: 2022-07-14 | Disposition: A | Discharge status: Home / Self Care | Payer: MEDICARE | Source: Ambulatory Visit | Attending: FAMILY PRACTICE | Admitting: FAMILY PRACTICE

## 2022-07-14 DIAGNOSIS — R141 Gas pain: Secondary | ICD-10-CM | POA: Insufficient documentation

## 2022-07-14 DIAGNOSIS — R142 Eructation: Secondary | ICD-10-CM | POA: Insufficient documentation

## 2022-07-14 DIAGNOSIS — R143 Flatulence: Secondary | ICD-10-CM | POA: Insufficient documentation

## 2022-07-14 DIAGNOSIS — R140 Abdominal distension (gaseous): Secondary | ICD-10-CM | POA: Insufficient documentation

## 2022-07-20 IMAGING — US US ABDOMEN COMPLETE
1 series · 14 of 25 positions shown · non-contrast
Comparison: None.

﻿EXAM: US ABDOMEN COMPLETE
INDICATION: Nausea and flatulence.

[Series 1: us abdomen complete · 14 of 63 slices shown]
[im 1/63]
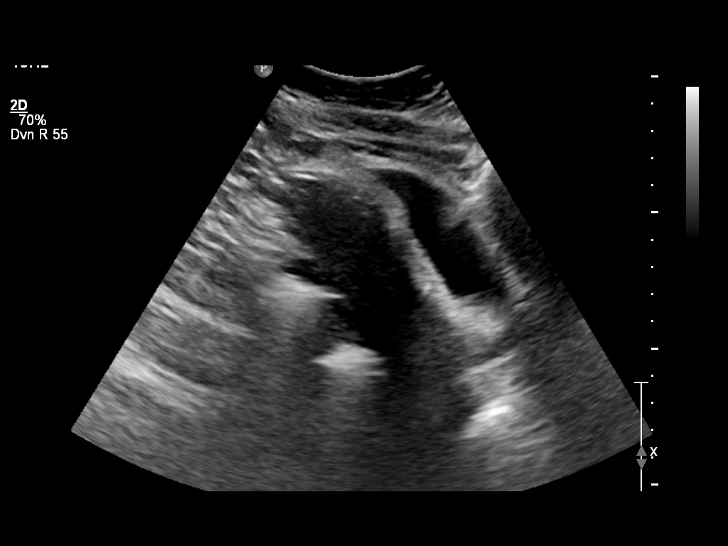
[im 6/63]
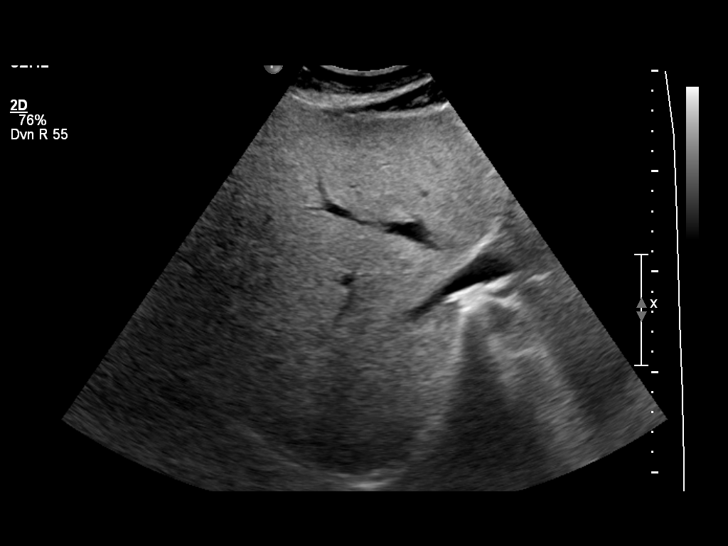
[im 11/63]
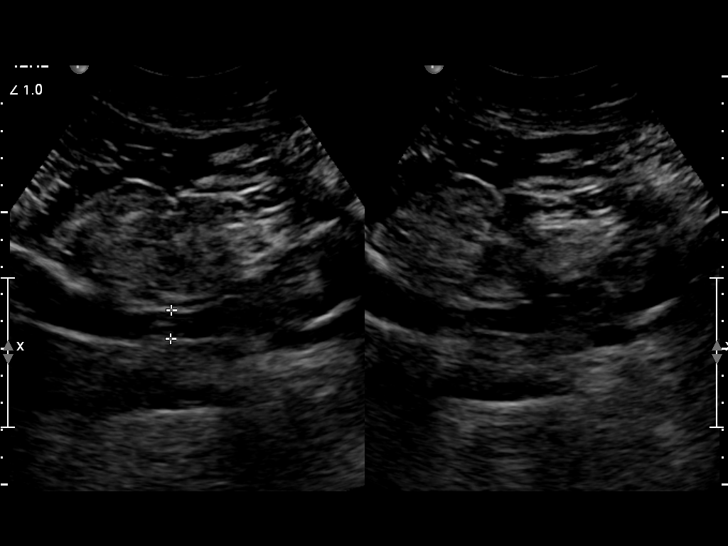
[im 16/63]
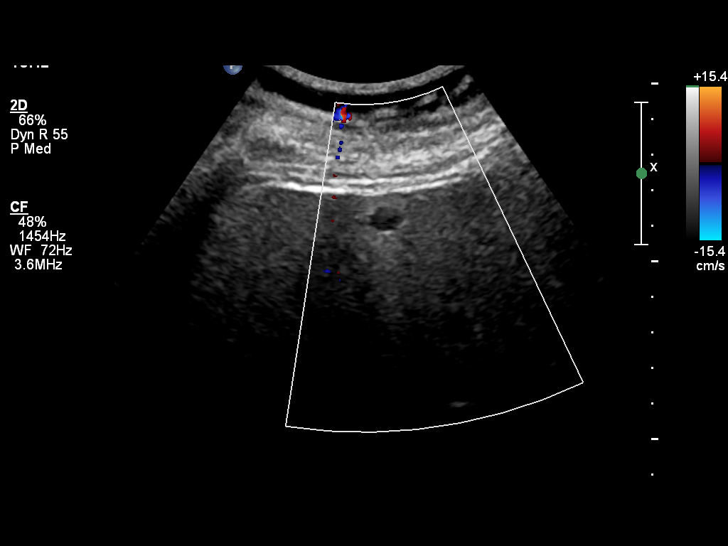
[im 21/63]
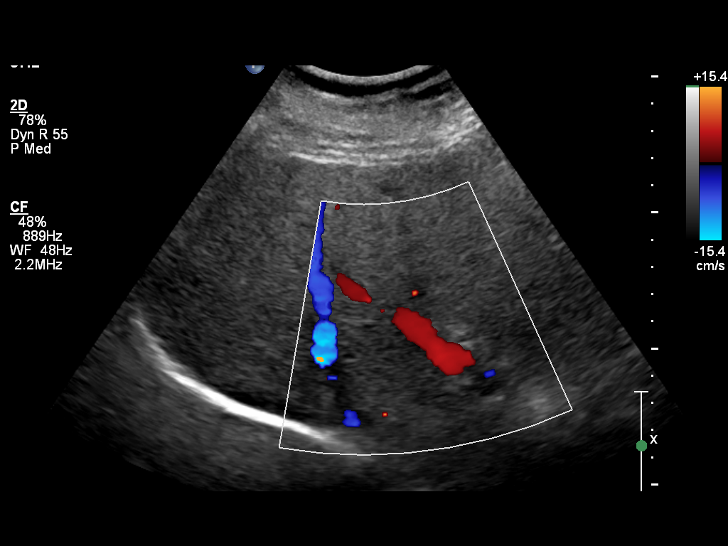
[im 24/63]
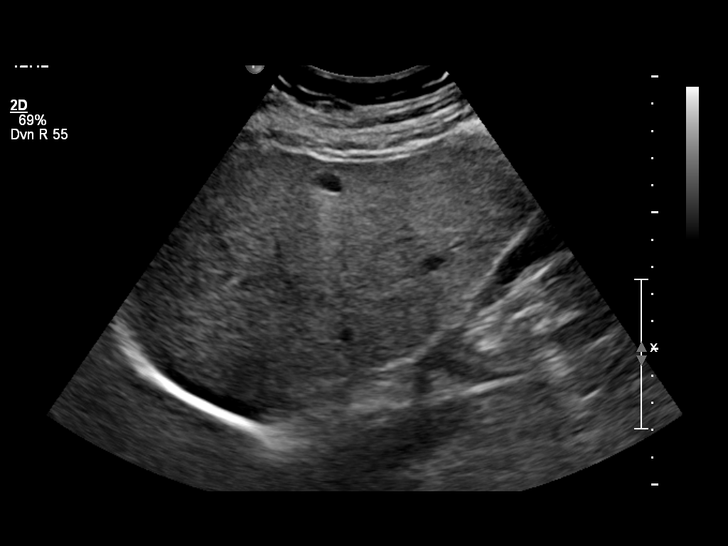
[im 29/63]
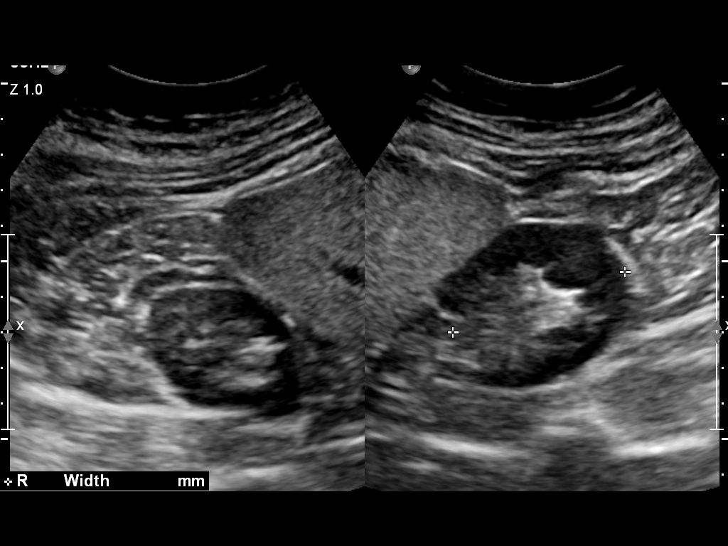
[im 34/63]
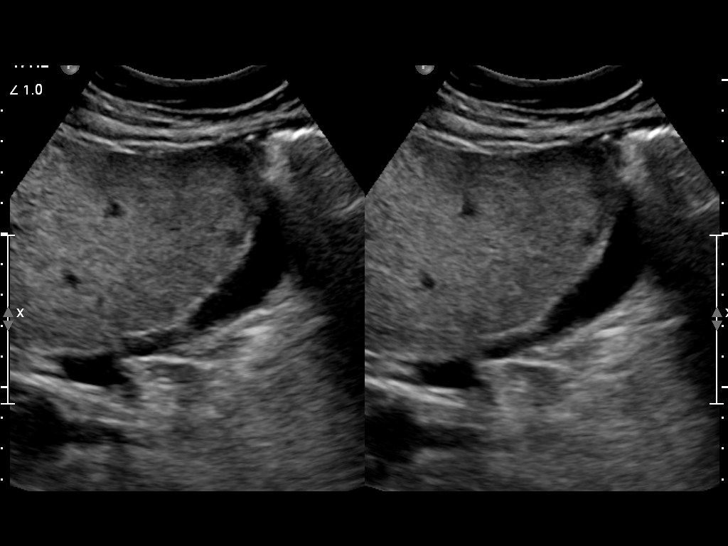
[im 39/63]
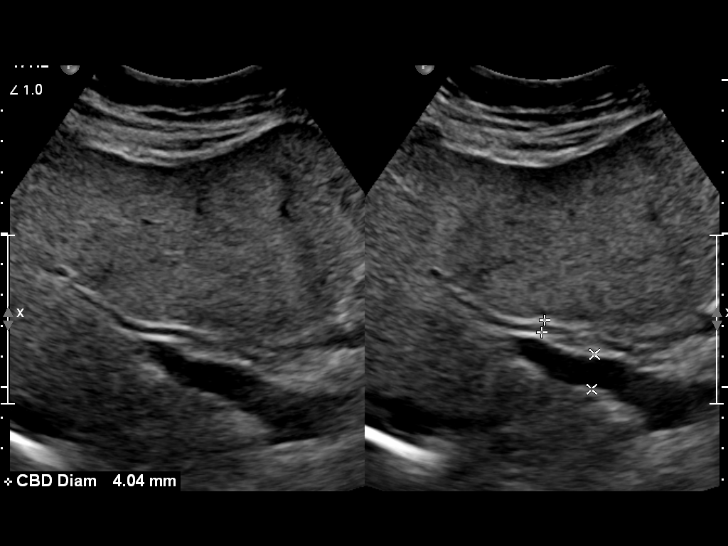
[im 42/63]
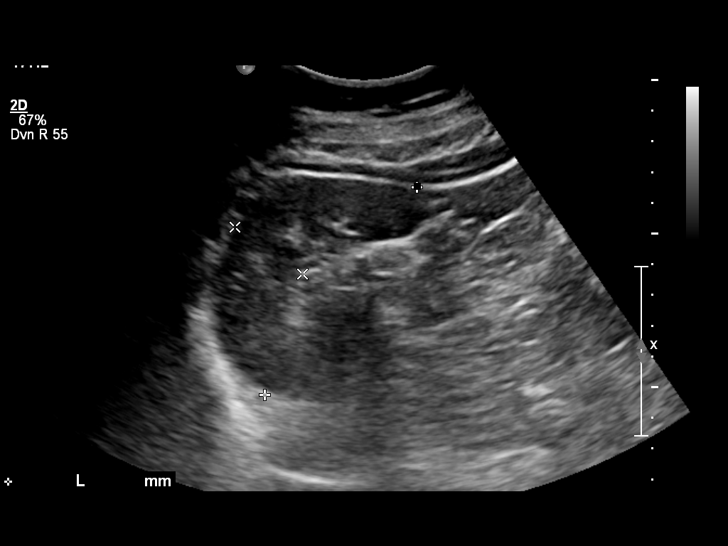
[im 47/63]
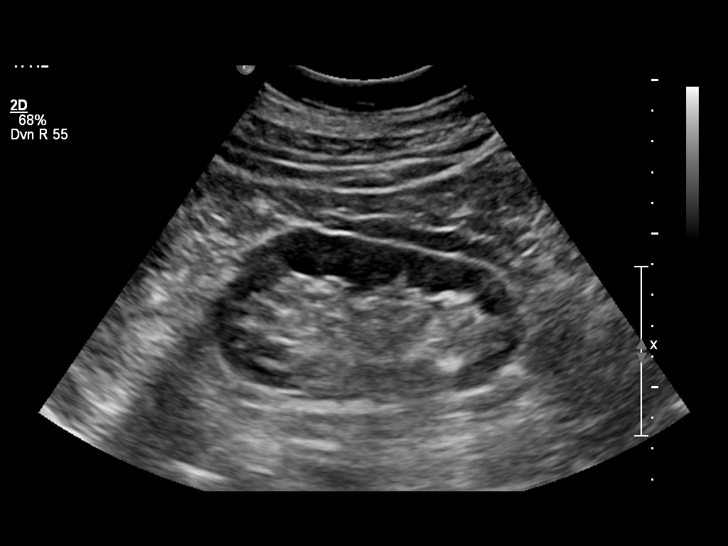
[im 52/63]
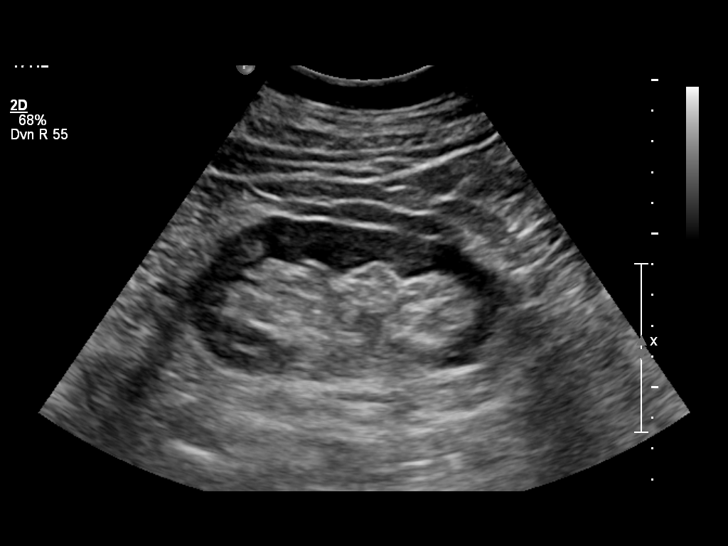
[im 57/63]
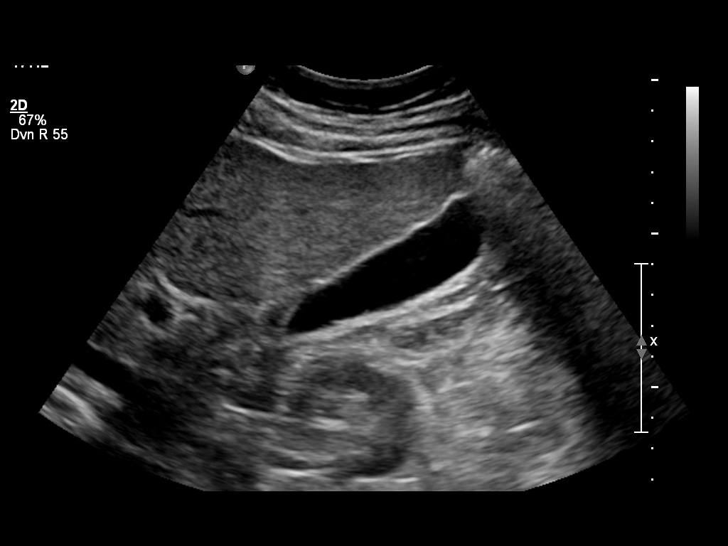
[im 63/63]
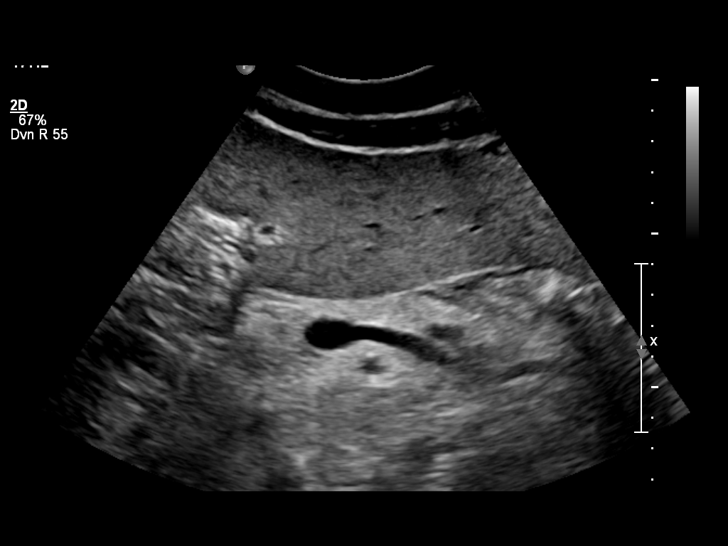

[14 of 25 positions shown; findings below may reference images not displayed]

FINDINGS: The liver demonstrates slightly increased diffuse echogenicity compatible with diffuse fatty liver infiltration.  There is a 1.4 cm right hepatic cyst.  There is no biliary dilatation.  

The gallbladder appears unremarkable with no evidence of cholelithiasis, gallbladder wall thickening, or pericholecystic fluid.  

The visualized pancreas, aorta, and inferior vena cava appear unremarkable.  The spleen is not enlarged.  

Both kidneys appear within normal limits.
IMPRESSION: Diffuse fatty liver infiltration.

## 2022-08-29 ENCOUNTER — Other Ambulatory Visit: Payer: Self-pay

## 2022-08-29 ENCOUNTER — Ambulatory Visit
Admission: RE | Admit: 2022-08-29 | Discharge: 2022-08-29 | Disposition: A | Discharge status: Home / Self Care | Payer: MEDICARE | Source: Ambulatory Visit | Attending: Family | Admitting: Family

## 2022-08-29 DIAGNOSIS — R7989 Other specified abnormal findings of blood chemistry: Secondary | ICD-10-CM | POA: Insufficient documentation

## 2022-10-11 ENCOUNTER — Other Ambulatory Visit (HOSPITAL_COMMUNITY): Payer: Self-pay

## 2022-10-11 DIAGNOSIS — M961 Postlaminectomy syndrome, not elsewhere classified: Secondary | ICD-10-CM

## 2022-10-22 ENCOUNTER — Ambulatory Visit (HOSPITAL_COMMUNITY): Payer: MEDICARE

## 2022-10-31 ENCOUNTER — Ambulatory Visit
Admission: RE | Admit: 2022-10-31 | Discharge: 2022-10-31 | Disposition: A | Discharge status: Home / Self Care | Payer: MEDICARE | Source: Ambulatory Visit

## 2022-10-31 ENCOUNTER — Other Ambulatory Visit: Payer: Self-pay

## 2022-10-31 DIAGNOSIS — M961 Postlaminectomy syndrome, not elsewhere classified: Secondary | ICD-10-CM

## 2022-10-31 MED ORDER — GADOBUTROL 10 MMOL/10 ML (1 MMOL/ML) INTRAVENOUS SOLUTION
10.0000 mL | INTRAVENOUS | Status: AC
Start: 2022-10-31 — End: 2022-10-31
  Administered 2022-10-31: 8 mL via INTRAVENOUS

## 2023-01-19 ENCOUNTER — Encounter (HOSPITAL_COMMUNITY): Payer: Self-pay

## 2023-01-19 ENCOUNTER — Other Ambulatory Visit (HOSPITAL_COMMUNITY): Payer: Self-pay

## 2023-01-19 DIAGNOSIS — M542 Cervicalgia: Secondary | ICD-10-CM

## 2023-01-31 ENCOUNTER — Other Ambulatory Visit (HOSPITAL_COMMUNITY): Payer: Self-pay

## 2023-01-31 DIAGNOSIS — M5440 Lumbago with sciatica, unspecified side: Secondary | ICD-10-CM

## 2023-01-31 DIAGNOSIS — M542 Cervicalgia: Secondary | ICD-10-CM

## 2023-02-20 ENCOUNTER — Telehealth (HOSPITAL_COMMUNITY): Payer: Self-pay | Admitting: Vascular & Interventional Radiology

## 2023-02-20 NOTE — OR PreOp (Signed)
Spoke to patient regarding procedure scheduled 02/21/23. Patient denies being on blood thinners or any injectable medications. Denies any allergies or previous reaction to sedation/anesthesia. Reviewed NPO after midnight instructions. Patient aware that she must have a driver home.

## 2023-02-21 ENCOUNTER — Ambulatory Visit (HOSPITAL_BASED_OUTPATIENT_CLINIC_OR_DEPARTMENT_OTHER)
Admission: RE | Admit: 2023-02-21 | Discharge: 2023-02-21 | Disposition: A | Discharge status: Home / Self Care | Payer: MEDICARE | Source: Ambulatory Visit

## 2023-02-21 ENCOUNTER — Ambulatory Visit
Admission: RE | Admit: 2023-02-21 | Discharge: 2023-02-21 | Disposition: A | Discharge status: Home / Self Care | Payer: MEDICARE | Source: Ambulatory Visit

## 2023-02-21 ENCOUNTER — Other Ambulatory Visit: Payer: Self-pay

## 2023-02-21 ENCOUNTER — Encounter (HOSPITAL_COMMUNITY): Payer: Self-pay

## 2023-02-21 DIAGNOSIS — M4316 Spondylolisthesis, lumbar region: Secondary | ICD-10-CM | POA: Insufficient documentation

## 2023-02-21 DIAGNOSIS — M47812 Spondylosis without myelopathy or radiculopathy, cervical region: Secondary | ICD-10-CM | POA: Insufficient documentation

## 2023-02-21 DIAGNOSIS — M48061 Spinal stenosis, lumbar region without neurogenic claudication: Secondary | ICD-10-CM | POA: Insufficient documentation

## 2023-02-21 DIAGNOSIS — M542 Cervicalgia: Secondary | ICD-10-CM

## 2023-02-21 DIAGNOSIS — M5440 Lumbago with sciatica, unspecified side: Secondary | ICD-10-CM

## 2023-02-21 DIAGNOSIS — M2578 Osteophyte, vertebrae: Secondary | ICD-10-CM | POA: Insufficient documentation

## 2023-02-21 DIAGNOSIS — M5116 Intervertebral disc disorders with radiculopathy, lumbar region: Secondary | ICD-10-CM | POA: Insufficient documentation

## 2023-02-21 DIAGNOSIS — Z981 Arthrodesis status: Secondary | ICD-10-CM | POA: Insufficient documentation

## 2023-02-21 HISTORY — DX: Essential (primary) hypertension: I10

## 2023-02-21 HISTORY — DX: Hypothyroidism, unspecified: E03.9

## 2023-02-21 HISTORY — DX: Sleep apnea, unspecified: G47.30

## 2023-02-21 MED ORDER — FENTANYL (PF) 50 MCG/ML INJECTION WRAPPER
INJECTION | Freq: Once | INTRAMUSCULAR | Status: AC | PRN
Start: 2023-02-21 — End: 2023-02-21
  Administered 2023-02-21 (×2): 25 ug via INTRAVENOUS

## 2023-02-21 MED ORDER — IOPAMIDOL 300 MG IODINE/ML (61 %) INTRATHECAL SOLUTION
15.0000 mL | INTRATHECAL | Status: AC
Start: 2023-02-21 — End: 2023-02-21
  Administered 2023-02-21: 15 mL via INTRATHECAL

## 2023-02-21 MED ORDER — HYDROCODONE 7.5 MG-ACETAMINOPHEN 325 MG TABLET
1.0000 | ORAL_TABLET | Freq: Four times a day (QID) | ORAL | Status: DC | PRN
Start: 2023-02-21 — End: 2023-02-21

## 2023-02-21 MED ORDER — MIDAZOLAM 5 MG/ML INJECTION WRAPPER
INTRAMUSCULAR | Status: AC
Start: 2023-02-21 — End: 2023-02-21
  Filled 2023-02-21: qty 2

## 2023-02-21 MED ORDER — LIDOCAINE HCL 20 MG/ML (2 %) INJECTION SOLUTION
Freq: Once | INTRAMUSCULAR | Status: AC | PRN
Start: 2023-02-21 — End: 2023-02-21
  Administered 2023-02-21: 5 mL via INTRADERMAL

## 2023-02-21 MED ORDER — HYDROCODONE 5 MG-ACETAMINOPHEN 325 MG TABLET
1.5000 | ORAL_TABLET | Freq: Four times a day (QID) | ORAL | Status: DC | PRN
Start: 2023-02-21 — End: 2023-02-22
  Administered 2023-02-21: 1.5 via ORAL
  Filled 2023-02-21: qty 2

## 2023-02-21 MED ORDER — SODIUM CHLORIDE 0.9 % INTRAVENOUS SOLUTION
INTRAVENOUS | Status: DC
Start: 2023-02-21 — End: 2023-02-22

## 2023-02-21 MED ORDER — FENTANYL (PF) 50 MCG/ML INJECTION SOLUTION
INTRAMUSCULAR | Status: AC
Start: 2023-02-21 — End: 2023-02-21
  Filled 2023-02-21: qty 4

## 2023-02-21 MED ORDER — MIDAZOLAM 5 MG/ML INJECTION WRAPPER
Freq: Once | INTRAMUSCULAR | Status: AC | PRN
Start: 2023-02-21 — End: 2023-02-21
  Administered 2023-02-21: 2 mg via INTRAVENOUS

## 2023-02-21 NOTE — Brief Op Note (Signed)
02/21/23      Radiologist: Dr. Neomia Dear    Procedure: [facility] CERVICAL    Description of Procedure Findings:      Lumbar and cervical radiculopathy        Estimated Blood Loss:  1.0 mL        Diagnosis: Neck pain; as above; dictated report to follow      Neomia Dear, MD

## 2023-02-21 NOTE — Discharge Instructions (Addendum)
FOLLOW UP WITH SENDING PHYSICIAN AS DIRECTED     SIGNS AND SYMPTOMS TO REPORT: TEMP GREATER THAN 100.4, EXCESSIVE, FOUL DRAINAGE, OR BLEEDING FROM SURGICAL SITE, UNCONTROLLED PAIN    MAY SHOWER TOMORROW IN THE EVENING    MAY RESUME HOME MEDICATIONS

## 2023-02-21 NOTE — Nurses Notes (Signed)
Both feet warm to touch. Pulse on the left foot strong and palpated. Pulse on right foot not palpated. Doppler used to both feet. Strong, loud pulse noted left foot. Weak pulse auscultated on the right foot. States both feet are numb. States left is more than right. States same as pre op.

## 2023-02-22 ENCOUNTER — Encounter (INDEPENDENT_AMBULATORY_CARE_PROVIDER_SITE_OTHER): Payer: Self-pay | Admitting: NEUROLOGY

## 2023-02-22 DIAGNOSIS — M5440 Lumbago with sciatica, unspecified side: Secondary | ICD-10-CM

## 2023-04-10 ENCOUNTER — Other Ambulatory Visit (HOSPITAL_COMMUNITY): Payer: Self-pay

## 2023-04-10 DIAGNOSIS — M25551 Pain in right hip: Secondary | ICD-10-CM

## 2023-04-12 ENCOUNTER — Other Ambulatory Visit: Payer: Self-pay

## 2023-04-12 ENCOUNTER — Ambulatory Visit
Admission: RE | Admit: 2023-04-12 | Discharge: 2023-04-12 | Disposition: A | Discharge status: Home / Self Care | Payer: MEDICARE | Source: Ambulatory Visit

## 2023-04-12 DIAGNOSIS — M25551 Pain in right hip: Secondary | ICD-10-CM | POA: Insufficient documentation

## 2023-06-29 ENCOUNTER — Encounter (INDEPENDENT_AMBULATORY_CARE_PROVIDER_SITE_OTHER): Payer: MEDICARE | Admitting: NEUROLOGY

## 2023-08-11 IMAGING — MG 3D SCREENING MAMMO BIL AND TOMO
5 series · 8 of 24 positions shown · non-contrast
Comparison: 02/09/2023, 12/01/2021, 11/28/2020.

------------- REPORT [id number] -------------
﻿

EXAM:  3D SCREENING MAMMO BIL AND TOMO
INDICATION: Screening mammogram.  Asymptomatic 77-year-old with family history in her mother.  Lifetime breast cancer risk 8.2%.

[L]
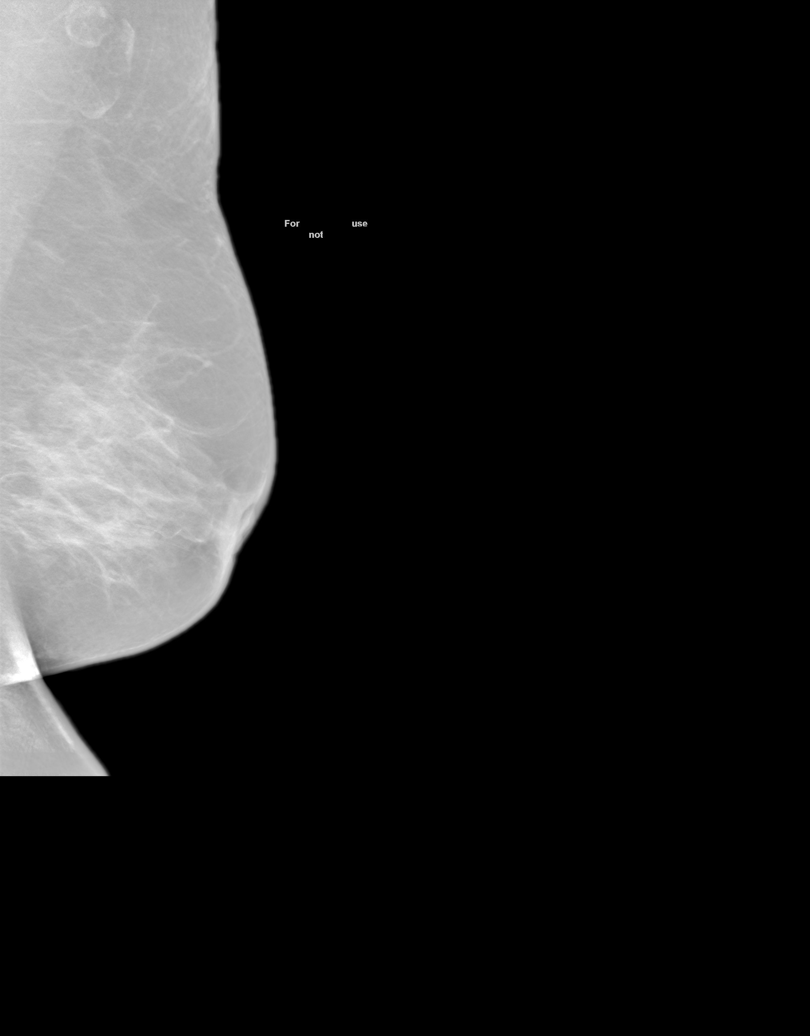

[R]
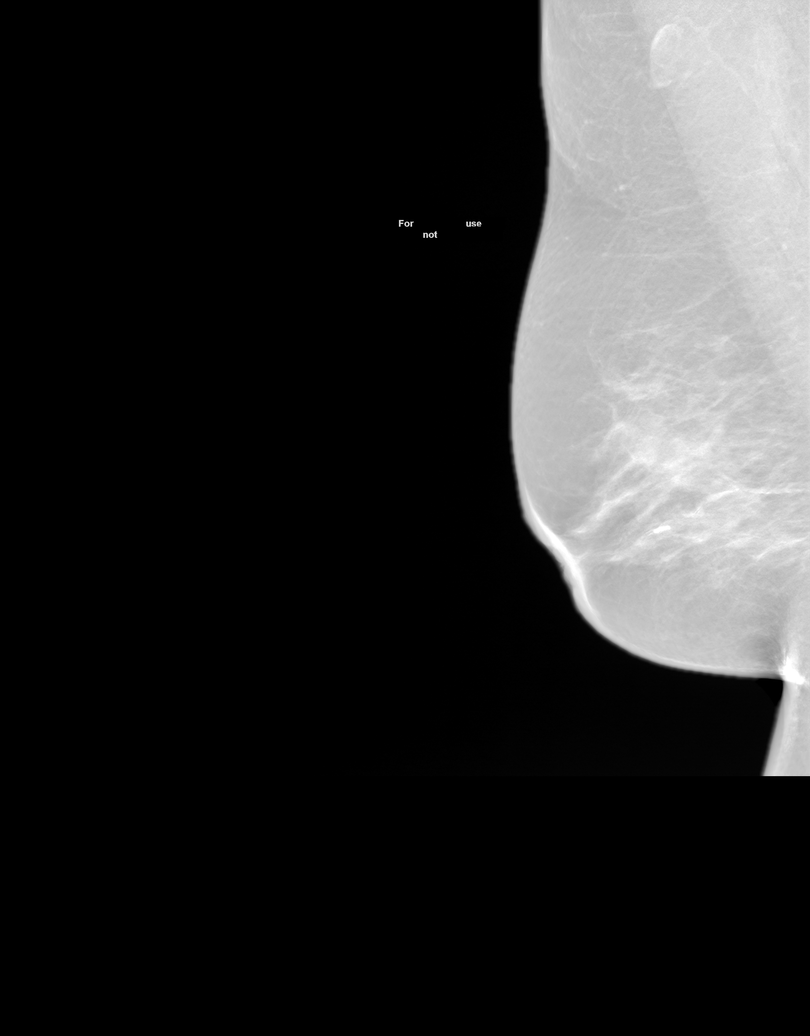

[R CC tomo · right · 0.10mm/px · 2 of 2 slices shown]
[im 1/2]
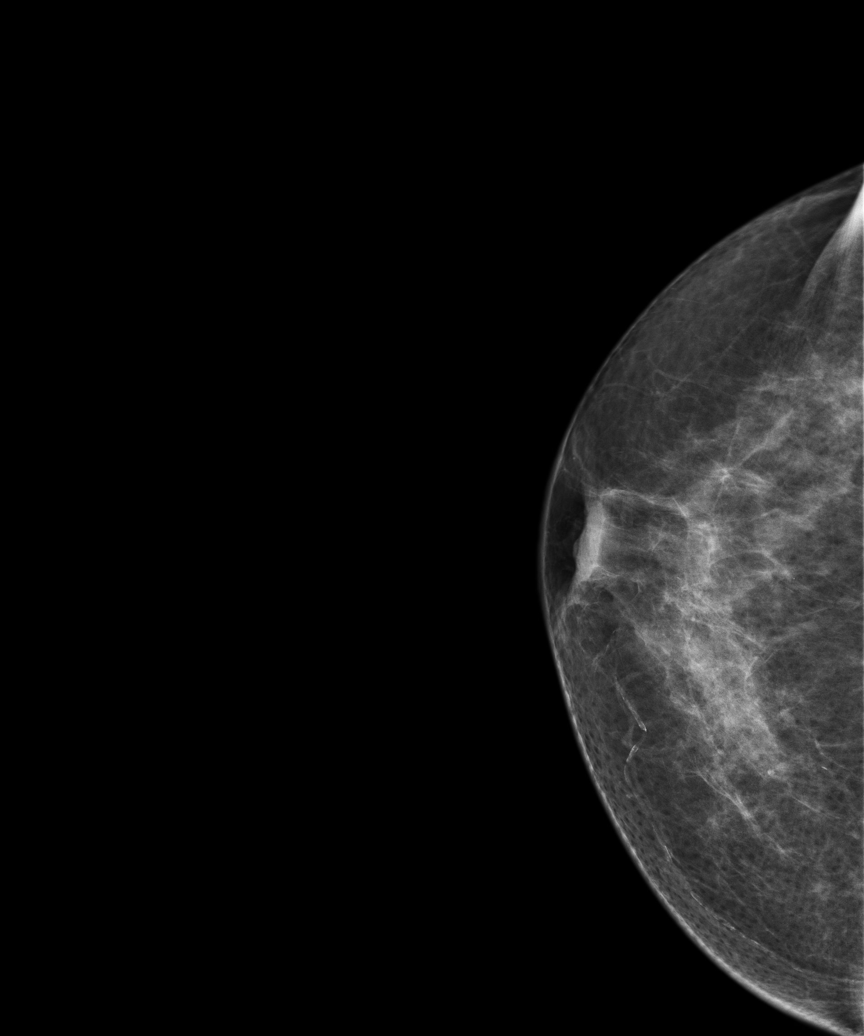
[im 2/2]
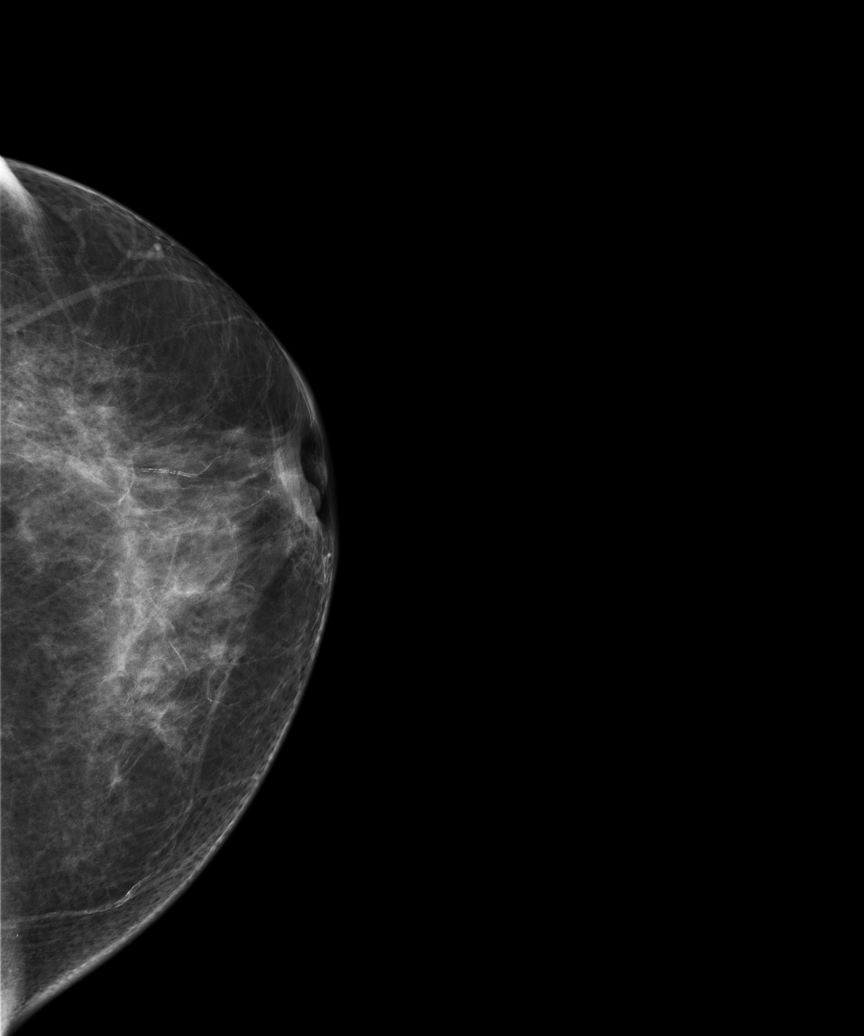

[3D SCREENING MAMMO BIL AND TOMO tomo · 2 acquisitions, 3 frames shown (1 of 2)]
[im 1/2]
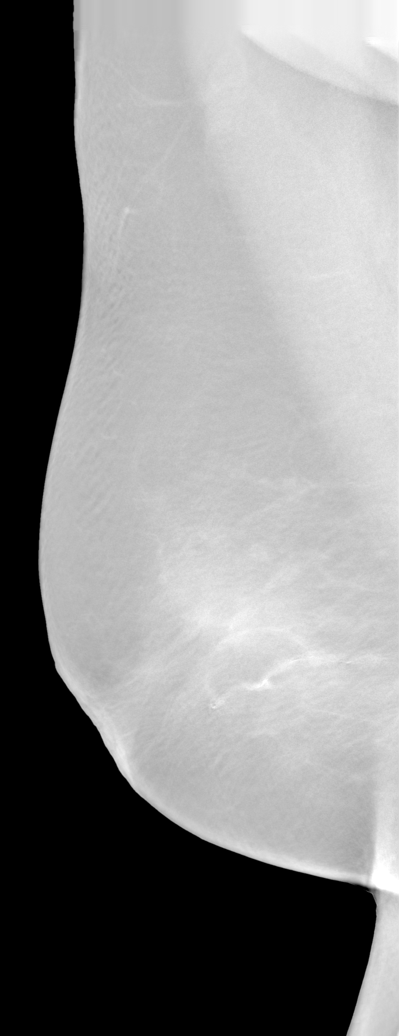
[im 2/2]
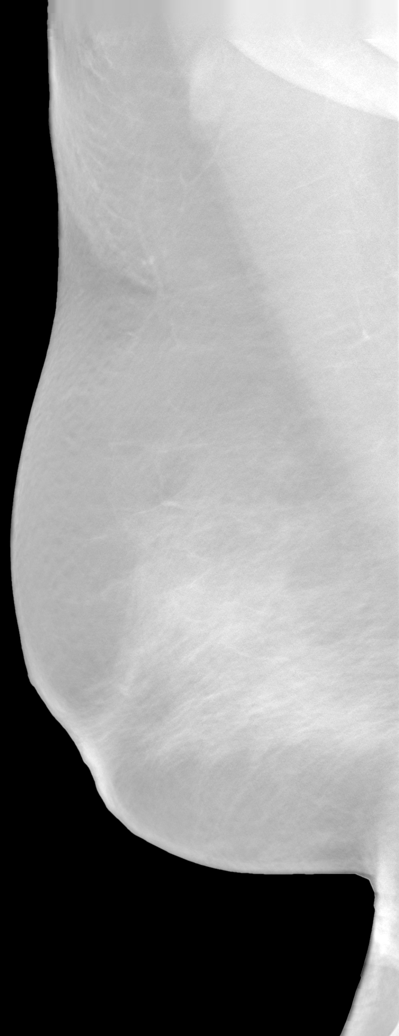
[im 2/2]
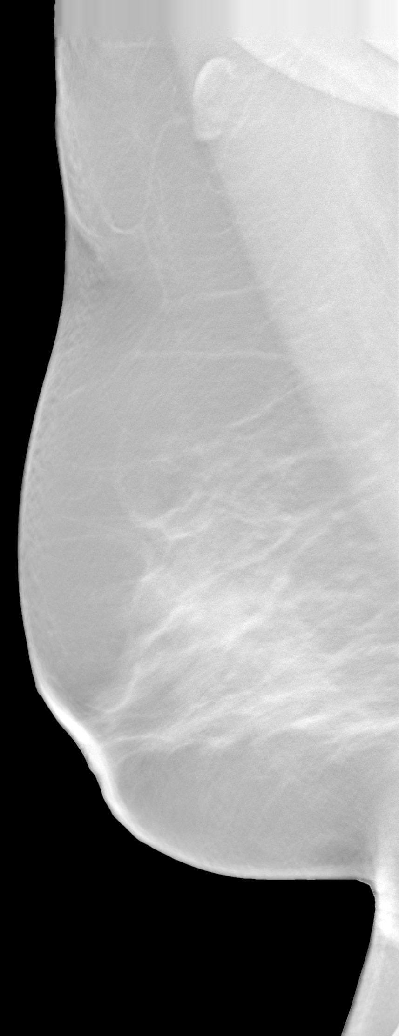

[3D SCREENING MAMMO BIL AND TOMO tomo (2 of 2) · tomo slice 11/71.0]
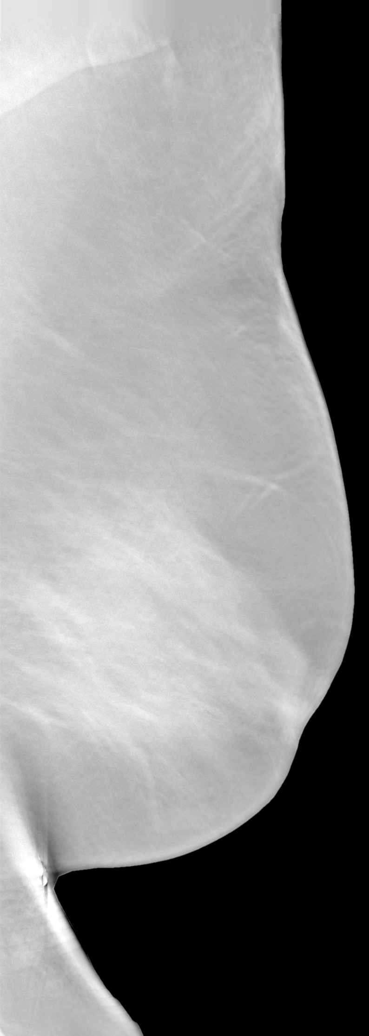

[8 of 24 positions shown; findings below may reference images not displayed]

FINDINGS: Breast parenchyma is heterogeneously dense.  There is no mass or suspicious cluster of microcalcifications.  There is no architectural distortion, skin thickening or nipple retraction.
IMPRESSION: 1.  BIRADS 2-Benign findings. Patient has been added in a reminder system with a target date for the next screening mammography.

2.  DENSITY CODE –  C (Heterogeneously dense) 

Final Assessment Code:

BI-RADS 0
 Need additional imaging evaluation.

BI-RADS 1
 Negative mammogram.

BI-RADS 2
 Benign finding.

BI-RADS 3
 Probably benign finding; short-interval follow-up suggested.

BI-RADS 4
 Suspicious abnormality; biopsy should be considered.

BI-RADS 5
 Highly suggestive of malignancy; appropriate action should be taken.

BI-RADS 6
 Known biopsy-proven malignancy; appropriate action should be taken.

NOTE:
In compliance with Federal regulations, the results of this mammogram are being sent to the patient.

------------- REPORT [id number] -------------
[facility]
[address]
We wish to report the following on your recent mammography examination. We are sending a report to your referring physician or other health care provider.
FINDING: Normal/Negative-No evidence of cancer.

This statement is mandated by the [place], Department of Health.
Your examination was performed by one of our technologists, who are registered radiological technologists and also specially certified in mammography:
___
Tawanda, Kangeta (M)

Your mammogram was interpreted by our radiologist.

( 
Kuninori Sisido, M.D.

(Annual Breast Examination by a physician or other health care provider
(Annual Mammography Screening beginning at age 40
(Monthly Breast Self Examination

## 2023-12-08 ENCOUNTER — Other Ambulatory Visit (INDEPENDENT_AMBULATORY_CARE_PROVIDER_SITE_OTHER): Payer: Self-pay | Admitting: NURSE PRACTITIONER

## 2024-02-19 ENCOUNTER — Other Ambulatory Visit: Payer: Self-pay

## 2024-02-20 ENCOUNTER — Encounter (HOSPITAL_COMMUNITY): Payer: Self-pay | Admitting: SLEEP MEDICINE

## 2024-02-20 ENCOUNTER — Ambulatory Visit: Payer: MEDICARE | Attending: SLEEP MEDICINE | Admitting: SLEEP MEDICINE

## 2024-02-20 VITALS — BP 173/76 | HR 85 | Temp 98.2°F | Resp 20 | Ht 61.0 in | Wt 194.0 lb

## 2024-02-20 DIAGNOSIS — I10 Essential (primary) hypertension: Secondary | ICD-10-CM | POA: Insufficient documentation

## 2024-02-20 DIAGNOSIS — G4733 Obstructive sleep apnea (adult) (pediatric): Secondary | ICD-10-CM | POA: Insufficient documentation

## 2024-02-20 NOTE — H&P (Signed)
 PULMONARY, [facility]  [address]  [place] [address]  Operated by [facility]     New Patient Sleep Note    Patient Name: Tammy Strong  Date: 02/20/2024  Department:  PULMONARY, [facility]  MRN: [id number]  DOB: May 30, 1946  Primary Care Provider:  Earnest Daniels, DO  Referring Provider:  No ref. provider found    Chief Complaint:   Chief Complaint   Patient presents with    Establish Care     Pt presents to establish sleep care. She has a history of OSA requiring CPAP of 8cmH20. DME is [facility]. She has had machine for over 5 years and she does not feel the pressure is becoming weaker. She would like to change to [facility]. Last sleep study was approximately 10 years ago at [facility].          History of Present Illness  Tammy Strong is a 78 year old female with sleep apnea who presents for evaluation of her CPAP machine and consideration of a new sleep study.    She has a long-standing history of sleep apnea, initially diagnosed approximately 25 years ago. Her last sleep study was conducted about 10 years ago at [facility], and she has been using a CPAP machine 25 years.. The current machine is over 65 years old and is reportedly not functioning optimally, as it 'whistles during the night' and may have issues with pressure due to seal leakage.    Her initial diagnosis was prompted by severe symptoms, including significant daytime somnolence to the point where her 'head could hit the table' (fall asleep) if she sat for any length of time. Her husband initially noticed her snoring and gasping during sleep, which led to her first sleep study. She describes the CPAP as life-changing, stating it felt like she was 'born again' when she first used it, as it allowed her to take a 'good breath'.    She has no current breathing problems outside of her sleep apnea and does not use supplemental oxygen. No stomach issues, heartburn, or reflux, and her oxygen levels have  not been a concern during her sleep studies.    Her past medical history includes a severely ruptured appendix, which required prolonged intubation. The patient recalls being told by her doctor that scarring from her prolonged intubation after a ruptured appendix might have contributed to her sleep apnea.    She uses a nasal pillow type mask with her CPAP machine and has had no issues adjusting to its use. Her husband, Jerel, also uses a CPAP machine and initially had some anxiety but adjusted over time.     Epworth assessment done in office today with a total score of 7    Denies shortness of breath.      Never smoker.      Results           Past Medical history  Past Medical History:   Diagnosis Date    Hypertension     Hypothyroidism     Sleep apnea     CPAP     Past Surgical History  Past Surgical History:   Procedure Laterality Date    HX APPENDECTOMY      HX BACK SURGERY      HX HERNIA REPAIR      HX KNEE REPLACMENT Right     LUMBAR DISC SURGERY       Medication List  Current Outpatient Medications   Medication Sig  amLODIPine (NORVASC) 2.5 mg Oral Tablet Take 1 Tablet (2.5 mg total) by mouth Once a day    ergocalciferol, vitamin D2, (DRISDOL) 1,250 mcg (50,000 unit) Oral Capsule Take 1 Capsule (50,000 Units total) by mouth Every 7 days    furosemide (LASIX) 20 mg Oral Tablet Take 1 Tablet (20 mg total) by mouth Once a day    levothyroxine (SYNTHROID) 137 mcg Oral Tablet Take 1 Tablet (137 mcg total) by mouth Every morning    magnesium oxide (MAG-OX) 400 mg Oral Tablet Take 1 Tablet (400 mg total) by mouth Twice daily    metoprolol tartrate (LOPRESSOR) 25 mg Oral Tablet Take 1 Tablet (25 mg total) by mouth Twice daily    potassium chloride (K-DUR) 10 mEq Oral Tab Sust.Rel. Particle/Crystal Take 1 Tablet (10 mEq total) by mouth Once a day    valsartan (DIOVAN) 160 mg Oral Tablet Take 1 Tablet (160 mg total) by mouth Once a day     Allergy List  Allergy History as of 02/20/24        No Known Allergies                   Family History   Family Medical History:    None         Social History  Social History     Socioeconomic History    Marital status: Married     Spouse name: Not on file    Number of children: Not on file    Years of education: Not on file    Highest education level: Not on file   Occupational History    Not on file   Tobacco Use    Smoking status: Never     Passive exposure: Past    Smokeless tobacco: Never   Substance and Sexual Activity    Alcohol use: Not Currently    Drug use: Never    Sexual activity: Not on file   Other Topics Concern    Not on file   Social History Narrative    Not on file     Social Determinants of Health     Financial Resource Strain: Not on file   Transportation Needs: Not on file   Social Connections: Not on file   Intimate Partner Violence: Not on file   Housing Stability: Not on file       Review of system  General:  Denies fever, chills, night sweats, loss of appetite.  Neurological:  Denies dizziness or seizures.  Ears, Nose, Throat: Denies ear pain, nasal/sinus congestion or pain, or sore throat.   Gastrointestinal:  Denies uncontrolled reflux, heartburn, nausea, or diarrhea.  Cardiovascular:  Denies chest pain or irregular heartbeats.  Respiratory: see HPI  Musculoskeletal:  Denies uncontrolled joint pain or restless legs.  Endocrine/Metabolic:  Denies weight gain or weight loss.  Mental Status/Psychiatric:  Denies uncontrolled anxiety or depression.  Integumentary:  Denies rash or new abnormal skin lesions.    Vital Signs  Vitals:    02/20/24 0805   BP: (!) 173/76   Pulse: 85   Resp: 20   Temp: 36.8 C (98.2 F)   TempSrc: Oral   SpO2: 97%   Weight: 88 kg (194 lb)   Height: 1.549 m (5' 1)   BMI: 36.66     Neck (Inches): 16.75    PHYSICAL EXAMINATION:   Constitutional:  Vital signs stable.  General appearance of the patient:  Alert, no acute distress.  Normal appearance, well nourished.  Eyes:  Normal eye lids.  Conjunctiva normal.  Ears, Nose, Mouth, and Throat: External  inspection of ears and nose with normal appearance.  Nares patent.  Neck: Supple with trachea midline, non tender, no nodules, no masses, gland position midline.  Respiratory:  Auscultation of lungs with normal breath sounds, no rales, no rhonchi, no wheezing.  Respiratory effort with no tractions, breathing regular and unlabored.  Cardiovascular:  Regular rhythm and regular rate.  No murmur, no peripheral edema.  Musculoskeletal:  Normal gait and station, normal digits, no digital cyanosis or clubbing.  Mental Status/Psychiatric:  Alert- oriented to person, place, and time.  Appropriate and normal mood.  Physical Exam  HEENT: crowded oropharynx  CHEST: Clear to auscultation bilaterally  CARDIOVASCULAR: Normal heart sounds  EXTREMITIES: No edema         ICD-10-CM    1. OSA (obstructive sleep apnea)  G47.33 POLYSOMNOGRAPHY - WITH CPAP (MUST BE PRECEDED BY OVERNIGHT (ANP) STUDY     CANCELED: POLYSOMNOGRAPHY-ANP OVERNIGHT-1ST NIGHT OF STUDY (F/U WITH CPAP IF ANP MEETS CLINICAL CRITERIA)    CPAP dependence      2. Essential hypertension, benign  I10       3. Morbid obesity (CMS HCC)  E66.01          Assessment & Plan  Obstructive sleep apnea  Obstructive sleep apnea diagnosed approximately 25 years ago with severe symptoms including significant daytime somnolence and snoring. Last sleep study conducted 10 years ago. Current CPAP machine is over 41 years old and may be malfunctioning, as indicated by whistling sounds and potential loss of pressure. No current breathing problems or oxygen use reported. Possible scarring from past intubation due to ruptured appendix may contribute to condition. She expresses strong preference for CPAP use and reports significant improvement in symptoms with CPAP therapy.  - Order new sleep study, preferably a titration study. If original study cannot be obtained, perform a split night study to confirm diagnosis and initiate pressure titration.  - Order new CPAP machine setup with  appropriate pressures post-titration study.  - Schedule compliance visit within 60 to 90 days after receiving new CPAP machine to ensure proper usage and comfort.    Orders Placed This Encounter    CANCELED: POLYSOMNOGRAPHY-ANP OVERNIGHT-1ST NIGHT OF STUDY (F/U WITH CPAP IF ANP MEETS CLINICAL CRITERIA)    POLYSOMNOGRAPHY - WITH CPAP (MUST BE PRECEDED BY OVERNIGHT (ANP) STUDY      Instructions  Will obtain original sleep study from [facility].  This patient will need to have testing with polysomnogram for sleep apnea.  In lab required due to comorbidities including previous severe sleep apnea and hypertension. Test is ordered and will be scheduled.      Continue medications as prescribed/directed unless changed by provider.    Plan of care discussed with patient.    Return for after all testing and 2-3 months after new CPAP set up... Patient was advised to come back earlier if any symptoms get worse.  Also advised to come back for results of any test done.  If unable to keep regular appointment, patient was advised to schedule another appointment as soon as possible.     The patient was given the opportunity to ask questions and those questions were answered to the patient's satisfaction. The patient was encouraged to call with any additional questions or concerns. Discussed with patient effects and side effects of medications. Medication safety was discussed.  The patient was informed to contact the office within 7 business days  if a message/lab results/referral/imaging results have not been conveyed to the patient.    Electronically signed by Ronal Kerns, FNP-BC   Pulmonary and Critical care    This note was created with assistance from [employer] via capture of conversational audio. Consent was obtained from the patient and all parties present prior to recording.      This note may have been partially generated using MModal Fluency Direct system, and there may be some incorrect words, spellings, and punctuation  that were not noted in checking the note before saving.

## 2024-03-29 ENCOUNTER — Ambulatory Visit
Admission: RE | Admit: 2024-03-29 | Discharge: 2024-03-29 | Disposition: A | Discharge status: Home / Self Care | Payer: MEDICARE | Source: Ambulatory Visit | Attending: SLEEP MEDICINE

## 2024-03-29 ENCOUNTER — Other Ambulatory Visit: Payer: Self-pay

## 2024-03-29 DIAGNOSIS — Z23 Encounter for immunization: Secondary | ICD-10-CM | POA: Insufficient documentation

## 2024-03-29 DIAGNOSIS — G4733 Obstructive sleep apnea (adult) (pediatric): Secondary | ICD-10-CM | POA: Insufficient documentation

## 2024-04-01 NOTE — Procedures (Signed)
 [facility]    Titration Study    Patient Name: Tammy Strong  Date of Birth: 12-May-1946  Gender: female  Provider: Manus Free, DO  Location: [facility]  Location Address: [address] [place], [address]  [facility]  Location Phone: [phone] [facility]      Primary Care Provider: Earnest Daniels, DO  Referring Provider: Ronal Kerns, NP    Procedure Titration Study  Past Medical History  Past Medical History:   Diagnosis Date    Hypertension     Hypothyroidism     Sleep apnea     CPAP           Past Surgical History  Past Surgical History:   Procedure Laterality Date    HX APPENDECTOMY      HX BACK SURGERY      HX HERNIA REPAIR      HX KNEE REPLACMENT Right     LUMBAR DISC SURGERY             Medication LIst  No outpatient medications have been marked as taking for the 03/29/24 encounter [facility] Encounter) with SLEEP LAB BED 1 PRN.       Allergy List  Allergies[1]    Family Medical History  Family Medical History:    None           Social History  Social History     Socioeconomic History    Marital status: Married   Tobacco Use    Smoking status: Never     Passive exposure: Past    Smokeless tobacco: Never   Substance and Sexual Activity    Alcohol use: Not Currently    Drug use: Never       Patient Information  Sleep apnea symptoms: Excessive Daytime Sleepiness, Snoring, and Witnessed Apneas  Patient's Height: 61.0  Patient's Weight: 194.0  Patient's BMI: 36.7  Neck Circumference: 16.75  Epworth Scale Score: 9    Date of Study: 03/29/2024  Study Performed By: VEAR. Delilah, LPN   Study Reviewed By: Charlies Mulders, RRT, RPSGT, CCSH    Procedure : Procedure: Complete PSG titration with digital sleep system using the international 10-20 electrode placement for recording EEG, EOG, EMG from chin, ECG, Respiratory Effort, Oximetry, body position, airflow, snoring sound, pulse rate, limb movement channels, and with positive airway pressure.    Sleep Architecture:   Total Recording Time: 371.0 min    Total Sleep Time: 357.0 min.   Sleep Efficiency Percentage: 75.2%.   Patient Sleep Latency (Minutes): 23.2 min.   Patient REM Latency (Minutes): 56.0 min.   Percentage of Stage I Sleep:  1.0 %.   Percentage of Stage II Sleep: 53.5 %.   Percentage of Stage III Sleep: 15.1 %.   Percentage (%) of REM sleep: 30.4 %.     Respiratory Data:   Number of Obstructive Apneas Observed: 0.0 .   Number of Central Apneas Observed: 0.0.   Number of Mixed Apneas Observed: 0.0.   Total Number of Apneas: 0.0.   Total number of Hypopneas: 13.   Snoring During the Study: Yes, moderate    Apnea Hypopnea Index (AHI) per hour: 2.2.   Percent of Time Patient Spent in Supine Position: 167.1 mins.   AHI in Supine Position #: 3.5.   Non-supine AHI #: 1.2.   REM AHI #: 6.6.   Non-REM AHI #:  0.2.   Lowest Desaturation Percentage: 83%.   Total Amount of Desaturated Time Below or  Equal to 5.0 .     Cardiac Data:   The Mean Heart Rate During the Study (Beats/Min): 79.4.   Sinus Rhythm: Normal.    Periodic Leg Movements:   Total Number of Periodic Leg Movements During the Study: 0.0.   PLM Index #: 0.0.    CPAP - BiPAP Titration Data : CPAP Yes   Average pressure used to best reduce AHI was?: 10.   At that pressure AHI was: 0.0.   CPAP tolerated well.    Mask used was: Dreamwear Nasal Pillows small.     Humidity was utilized.   The study was scored by: 30 second EPOCH, 4 % desaturation and  CMS guidelines.AASM Accredited     Assessment:   Diagnosis:   (R09.02) Hypoxemia ([id number]), (R06.83) Snoring (SCT), and (G47.33) OSA - Obstructive sleep apnea (SCT)    PLAN  Procedure Orders  CPAP, Humidifier, Cushion for Nasal Mask Interface, Pillow for Use on Nasal Cannula Mask Type Interface, Nasal Interface, Headgear, Chinstrap, Tubing, Disposable Filter, Non-disposable Filter, and Water Chamber    Instructions:   Patient advised to avoid sedatives, hypnotics, sedating antihistamines and alcohol until CPAP titration is completed and therapy  initiated., Reviewed raw sleep data and agree with therapeutic directions outlined in report., The natural history of obstructive sleep apnea and its tendency to be associated with cardiac, pulmonary, neuropsychiatric discussed in detail with patient., The etiology of OSA was discussed in detail with patient, Driving while drowsy was discussed in detail, Reviews of trustworthy websites recommended, All benefits of CPAP/BiPAP discussed in detail, Advised to call if any difficulties or questions regarding CPAP/BiPAP device, Positional Therapy may be helpful in snoring, Nasal steroids may be helpful for primary snoring, and Evaluation of PLMS would appear prudent    Impression  AHI 0.0 on a CPAP of 10.   All stages of sleep were achieved.   Lowest recorded SpO2 83% for a duration of 5.0 mins.   Sleep efficiency 75.2%.    Aneta Charlies Mulders, RRT, RPSGT, CCSH         [1] No Known Allergies

## 2024-04-02 DIAGNOSIS — R0683 Snoring: Secondary | ICD-10-CM

## 2024-04-02 DIAGNOSIS — R0902 Hypoxemia: Secondary | ICD-10-CM

## 2024-04-12 ENCOUNTER — Other Ambulatory Visit (HOSPITAL_COMMUNITY): Payer: Self-pay | Admitting: SLEEP MEDICINE

## 2024-04-12 DIAGNOSIS — G4733 Obstructive sleep apnea (adult) (pediatric): Secondary | ICD-10-CM

## 2024-04-12 NOTE — Progress Notes (Signed)
 Polysomnography CPAP titration study done  on 03/29/24 and results reviewed and patient showing to have good control of apneic events with CPAP pressure of 10 cmH20 showing a residual AHI of 0. A desaturation of oxygen with lowest at 83 % And staying at or below 88% for 5 Minutes.     Mask used was Dreamwear nasal pillows small.  CPAP ordered through the patients DME of choice.    Last appt 02/20/24

## 2024-04-22 LAB — PARACHUTE DME

## 2024-06-25 ENCOUNTER — Ambulatory Visit (HOSPITAL_COMMUNITY): Payer: Self-pay | Admitting: SLEEP MEDICINE
